# Patient Record
Sex: Female | Born: 1980 | Race: White | Hispanic: No | Marital: Married | Smoking: Never smoker
Health system: Southern US, Community
[De-identification: ages and names within clinical notes are randomized; demographics above are authoritative.]

## PROBLEM LIST (undated history)

## (undated) DIAGNOSIS — E230 Hypopituitarism: Secondary | ICD-10-CM

## (undated) DIAGNOSIS — O039 Complete or unspecified spontaneous abortion without complication: Secondary | ICD-10-CM

## (undated) DIAGNOSIS — N390 Urinary tract infection, site not specified: Secondary | ICD-10-CM

## (undated) DIAGNOSIS — O26899 Other specified pregnancy related conditions, unspecified trimester: Secondary | ICD-10-CM

## (undated) DIAGNOSIS — Z6791 Unspecified blood type, Rh negative: Secondary | ICD-10-CM

## (undated) DIAGNOSIS — E237 Disorder of pituitary gland, unspecified: Secondary | ICD-10-CM

## (undated) HISTORY — PX: WISDOM TOOTH EXTRACTION: SHX21

## (undated) HISTORY — DX: Complete or unspecified spontaneous abortion without complication: O03.9

## (undated) HISTORY — PX: ANKLE SURGERY: SHX546

---

## 2010-11-20 LAB — HIV ANTIBODY (ROUTINE TESTING W REFLEX)
HIV: NONREACTIVE
HIV: NONREACTIVE

## 2010-11-20 LAB — GC/CHLAMYDIA PROBE AMP, GENITAL
Chlamydia: NEGATIVE
Chlamydia: NEGATIVE
Chlamydia: NEGATIVE
Gonorrhea: NEGATIVE
Gonorrhea: NEGATIVE

## 2010-11-20 LAB — HEPATITIS B SURFACE ANTIGEN: Hepatitis B Surface Ag: NEGATIVE

## 2010-11-20 LAB — ANTIBODY SCREEN: Antibody Screen: NEGATIVE

## 2010-11-20 LAB — RPR
RPR: NONREACTIVE
RPR: NONREACTIVE
RPR: NONREACTIVE
RPR: NONREACTIVE
RPR: NONREACTIVE

## 2011-08-14 LAB — STREP B DNA PROBE: GBS: NEGATIVE

## 2011-08-27 NOTE — L&D Delivery Note (Deleted)
Delivery Note At 9:34 PM a viable female, "(Weston) Renaee Munda" was delivered via Vaginal, Spontaneous Delivery (Presentation: ;  ).  APGAR: 8, 9; weight 6 lb 9 oz (2977 g).   Placenta status: Intact, Expressed.  Cord: 3 vessels with the following complications: None.  Cord pH: NA  Anesthesia: None  Episiotomy:  Lacerations: 2nd degree Suture Repair: 2.0 3.0 vicryl Est. Blood Loss (mL): 300  Mom to postpartum.  Baby to skin to skin with dad and mom.  Circ planned as inpatient.  Nigel Bridgeman 09/14/2011, 10:46 PM

## 2011-08-27 NOTE — L&D Delivery Note (Signed)
Delivery Note  At 9:34 PM a viable female, "(Weston) Renaee Munda" was delivered via Vaginal, Spontaneous Delivery (Presentation OA: ; ), waterbirth. APGAR: 8, 9; weight 6 lb 9 oz (2977 g).  Placenta status: Intact, Expressed. Cord: 3 vessels with the following complications: CAN X 1, loose, with delivery through the cord in the tub. Cord pH: NA   Anesthesia: None  Episiotomy:  Lacerations: 2nd degree  Suture Repair: 2.0 3.0 vicryl  Est. Blood Loss (mL): 300   Mom to postpartum. Baby to skin to skin with dad and mom.  Circ planned as inpatient.   Nigel Bridgeman  09/14/2011, 10:46 PM

## 2011-09-14 ENCOUNTER — Inpatient Hospital Stay (HOSPITAL_COMMUNITY)
Admission: AD | Admit: 2011-09-14 | Discharge: 2011-09-16 | DRG: 775 | Disposition: A | Discharge status: Home / Self Care | Payer: 59 | Source: Ambulatory Visit | Attending: Obstetrics and Gynecology | Admitting: Obstetrics and Gynecology

## 2011-09-14 ENCOUNTER — Encounter (HOSPITAL_COMMUNITY): Payer: Self-pay | Admitting: *Deleted

## 2011-09-14 DIAGNOSIS — Z6791 Unspecified blood type, Rh negative: Secondary | ICD-10-CM

## 2011-09-14 DIAGNOSIS — N970 Female infertility associated with anovulation: Secondary | ICD-10-CM

## 2011-09-14 DIAGNOSIS — IMO0001 Reserved for inherently not codable concepts without codable children: Secondary | ICD-10-CM

## 2011-09-14 DIAGNOSIS — IMO0002 Reserved for concepts with insufficient information to code with codable children: Secondary | ICD-10-CM

## 2011-09-14 HISTORY — DX: Urinary tract infection, site not specified: N39.0

## 2011-09-14 HISTORY — DX: Unspecified blood type, rh negative: Z67.91

## 2011-09-14 HISTORY — DX: Hypopituitarism: E23.0

## 2011-09-14 HISTORY — DX: Other specified pregnancy related conditions, unspecified trimester: O26.899

## 2011-09-14 HISTORY — DX: Disorder of pituitary gland, unspecified: E23.7

## 2011-09-14 MED ORDER — ONDANSETRON HCL 4 MG/2ML IJ SOLN: 4.0000 mg | Freq: Four times a day (QID) | INTRAMUSCULAR | Status: DC | PRN

## 2011-09-14 MED ORDER — OXYTOCIN 20 UNITS IN LACTATED RINGERS INFUSION - SIMPLE: 125.0000 mL/h | Freq: Once | INTRAVENOUS | Status: DC

## 2011-09-14 MED ORDER — ACETAMINOPHEN 325 MG PO TABS: 650.0000 mg | ORAL_TABLET | ORAL | Status: DC | PRN

## 2011-09-14 MED ORDER — OXYTOCIN BOLUS FROM INFUSION
500.0000 mL | Freq: Once | INTRAVENOUS | Status: DC
  Filled 2011-09-14: qty 500

## 2011-09-14 MED ORDER — CITRIC ACID-SODIUM CITRATE 334-500 MG/5ML PO SOLN: 30.0000 mL | ORAL | Status: DC | PRN

## 2011-09-14 MED ORDER — FLEET ENEMA 7-19 GM/118ML RE ENEM: 1.0000 | ENEMA | RECTAL | Status: DC | PRN

## 2011-09-14 MED ORDER — OXYCODONE-ACETAMINOPHEN 5-325 MG PO TABS: 2.0000 | ORAL_TABLET | ORAL | Status: DC | PRN

## 2011-09-14 MED ORDER — LIDOCAINE HCL (PF) 1 % IJ SOLN
30.0000 mL | INTRAMUSCULAR | Status: DC | PRN
  Filled 2011-09-14: qty 30

## 2011-09-14 MED ORDER — IBUPROFEN 600 MG PO TABS
600.0000 mg | ORAL_TABLET | Freq: Four times a day (QID) | ORAL | Status: DC | PRN
  Administered 2011-09-14: 600 mg via ORAL
  Filled 2011-09-14: qty 1

## 2011-09-14 MED ORDER — LACTATED RINGERS IV SOLN: 500.0000 mL | INTRAVENOUS | Status: DC | PRN

## 2011-09-14 NOTE — Progress Notes (Signed)
  Subjective: In tub, feeling some pressure.  Doula, Melanie Johnston, and husband at bedside.  Objective: BP 124/90  Pulse 62  Temp(Src) 97.8 F (36.6 C) (Oral)  Resp 18  Ht 5\' 4"  (1.626 m)  Wt 141 lb (63.957 kg)  BMI 24.20 kg/m2      FHT:  Category 1 UC:   regular, every 3 minutes SVE:   Dilation: Lip/rim Effacement (%): 100 Station: +1 Exam by:: Melanie Johnston  Labs: No results found for this basename: WBC, HGB, HCT, MCV, PLT  Labs deferred at present  Assessment / Plan: Spontaneous labor, progressing normally Will continue to observe for increased pressure and urge to push.  Melanie Johnston 09/14/2011, 8:55 PM

## 2011-09-14 NOTE — Progress Notes (Signed)
Patient brought straight from the lobby. She is in for labor eval and c/o srom at 12pm. She reports decreased fetal movement.

## 2011-09-14 NOTE — Progress Notes (Signed)
Pt in birthing tub, Manfred Arch, CNM in room.  toco off per CNM, pt feeling lots of pressure.

## 2011-09-15 LAB — RH IG WORKUP (INCLUDES ABO/RH)
Antibody Screen: POSITIVE
Unit division: 0

## 2011-09-15 LAB — GC/CHLAMYDIA PROBE AMP, GENITAL: Chlamydia: NEGATIVE

## 2011-09-15 LAB — CBC
HCT: 35.2 % — ABNORMAL LOW (ref 36.0–46.0)
Hemoglobin: 12.3 g/dL (ref 12.0–15.0)
MCHC: 34.9 g/dL (ref 30.0–36.0)
WBC: 14.9 10*3/uL — ABNORMAL HIGH (ref 4.0–10.5)

## 2011-09-15 LAB — HIV ANTIBODY (ROUTINE TESTING W REFLEX): HIV: NONREACTIVE

## 2011-09-15 LAB — RPR
RPR Ser Ql: NONREACTIVE
RPR: NONREACTIVE

## 2011-09-15 MED ORDER — ONDANSETRON HCL 4 MG/2ML IJ SOLN: 4.0000 mg | INTRAMUSCULAR | Status: DC | PRN

## 2011-09-15 MED ORDER — BENZOCAINE-MENTHOL 20-0.5 % EX AERO
INHALATION_SPRAY | CUTANEOUS | Status: AC
  Administered 2011-09-15: 1 via TOPICAL
  Filled 2011-09-15: qty 56

## 2011-09-15 MED ORDER — OXYCODONE-ACETAMINOPHEN 5-325 MG PO TABS: 1.0000 | ORAL_TABLET | ORAL | Status: DC | PRN

## 2011-09-15 MED ORDER — IBUPROFEN 600 MG PO TABS
600.0000 mg | ORAL_TABLET | Freq: Four times a day (QID) | ORAL | Status: DC
  Administered 2011-09-15 – 2011-09-16 (×6): 600 mg via ORAL
  Filled 2011-09-15 (×5): qty 1

## 2011-09-15 MED ORDER — RHO D IMMUNE GLOBULIN 1500 UNIT/2ML IJ SOLN
300.0000 ug | Freq: Once | INTRAMUSCULAR | Status: AC
  Administered 2011-09-15: 300 ug via INTRAMUSCULAR
  Filled 2011-09-15: qty 2

## 2011-09-15 MED ORDER — MEASLES, MUMPS & RUBELLA VAC ~~LOC~~ INJ
0.5000 mL | INJECTION | Freq: Once | SUBCUTANEOUS | Status: DC
  Filled 2011-09-15: qty 0.5

## 2011-09-15 MED ORDER — DIBUCAINE 1 % RE OINT: 1.0000 "application " | TOPICAL_OINTMENT | RECTAL | Status: DC | PRN

## 2011-09-15 MED ORDER — ONDANSETRON HCL 4 MG PO TABS: 4.0000 mg | ORAL_TABLET | ORAL | Status: DC | PRN

## 2011-09-15 MED ORDER — SENNOSIDES-DOCUSATE SODIUM 8.6-50 MG PO TABS
2.0000 | ORAL_TABLET | Freq: Every day | ORAL | Status: DC
  Administered 2011-09-15: 2 via ORAL

## 2011-09-15 MED ORDER — WITCH HAZEL-GLYCERIN EX PADS: 1.0000 "application " | MEDICATED_PAD | CUTANEOUS | Status: DC | PRN

## 2011-09-15 MED ORDER — SIMETHICONE 80 MG PO CHEW: 80.0000 mg | CHEWABLE_TABLET | ORAL | Status: DC | PRN

## 2011-09-15 MED ORDER — MAGNESIUM HYDROXIDE 400 MG/5ML PO SUSP: 30.0000 mL | ORAL | Status: DC | PRN

## 2011-09-15 MED ORDER — ZOLPIDEM TARTRATE 5 MG PO TABS: 5.0000 mg | ORAL_TABLET | Freq: Every evening | ORAL | Status: DC | PRN

## 2011-09-15 MED ORDER — DIPHENHYDRAMINE HCL 25 MG PO CAPS: 25.0000 mg | ORAL_CAPSULE | Freq: Four times a day (QID) | ORAL | Status: DC | PRN

## 2011-09-15 MED ORDER — BENZOCAINE-MENTHOL 20-0.5 % EX AERO
1.0000 "application " | INHALATION_SPRAY | CUTANEOUS | Status: DC | PRN
  Administered 2011-09-15: 1 via TOPICAL

## 2011-09-15 MED ORDER — PRENATAL MULTIVITAMIN CH
1.0000 | ORAL_TABLET | Freq: Every day | ORAL | Status: DC
  Administered 2011-09-15 – 2011-09-16 (×2): 1 via ORAL
  Filled 2011-09-15 (×2): qty 1

## 2011-09-15 MED ORDER — TETANUS-DIPHTH-ACELL PERTUSSIS 5-2.5-18.5 LF-MCG/0.5 IM SUSP: 0.5000 mL | Freq: Once | INTRAMUSCULAR | Status: DC

## 2011-09-15 MED ORDER — LANOLIN HYDROUS EX OINT: TOPICAL_OINTMENT | CUTANEOUS | Status: DC | PRN

## 2011-09-15 NOTE — H&P (Signed)
Melanie Johnston is a 31 y.o. female, G1P0 at 65 3/7 weeks presents in active labor, with SROM at 12 noon, clear fluid noted.  UCs q 2 minutes, accompanied by husband and doula, [name].  Pregnancy remarkable for: Rh negative Hx infertility, IVF pregnancy GBS negative  History of present pregnancy: Patient entered care at 32 weeks, as a transfer from [facility] desiring waterbirth.  She had embryo transfer 12/19/10.  EDC of 09/11/11 was established by IVF dating.  Anatomy scan was done at 17 weeks, with normal findings.  Further ultrasounds were done at 38 weeks, with normal growth and fluid.  Her prenatal course was essentially uncomplicated.  She attended waterbirth class and signed the consent.  OB History    Grav Para Term Preterm Abortions TAB SAB Ect Mult Living   1 1 1       1      Past Medical History  Diagnosis Date  . UTI (lower urinary tract infection)     occ.  . 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 Rh negative, maternal    Past Surgical History  Procedure Date  . Wisdom tooth extraction   . Ankle surgery     dislocation at age 84   Family History: family history is not on file.MGF heart disease.  Mother, father, PGF, PGM hypertension.  Mother thyroid disease.  MGF stroke.  MGM kidney defect  Social History:  reports that she has never smoked. She does not have any smokeless tobacco history on file. She reports that she does not drink alcohol or use illicit drugs.  Married to FOB, "Wiggy", who is a Patent examiner medicine MD in [place].  Patient is the Interior and spatial designer of recruiting, full time employed and graduated educated.  She is Caucasian, of the [place] faith.  She has been followed by the CNM service at [facility].  ROA:  Contractions every 2 minutes, leaking of clear fluid, positive FM, small amount bloody show.  Physical exam: Chest clear Heart RRR without murmur Abd gravid, NT FHR initially non-reactive,  no decels UCs q 3 min, strong. Small amount bloody show  Blood pressure 146/84, pulse 62, temperature 98.1 F (36.7 C), temperature source Oral, resp. rate 20, height 5\' 4"  (1.626 m), weight 141 lb (63.957 kg), SpO2 98.00%, unknown if currently breastfeeding.   Prenatal labs: ABO, Rh: A/--/-- (07/11 0000) Antibody: Negative (03/27 0000) Rubella:  Immune RPR: Nonreactive, Nonreactive, Nonreactive, Nonreactive, Nonreactive, Nonreactive (03/27 0000)  HBsAg: Negative (03/27 0000)  HIV: Non-reactive, Non-reactive, Non-reactive, Non-reactive, Non-reactive (03/27 0000)  GBS: Negative (12/19 0000)  1st trimester screen normal. Triple marker WNL Glucola WNL Hgb 13.2 at NOB/13.2 at 28 weeks Cultures negative at NOB Pap WNL 7/11  Assessment/Plan: IUP at 40 3/7 weeks Active labor SROM at 12 noon GBS negative Plans waterbirth.  Plan: Admitted to [facility] per consult with Dr. Estanislado Pandy Routine CNM orders Continuous EFM initially OK for tub use   Nigel Bridgeman, CNM, [place] 09/14/11 7:50pm

## 2011-09-15 NOTE — Progress Notes (Signed)
Post Partum Day 1 Subjective: no complaints, up ad lib and voiding  Objective: Blood pressure 117/74, pulse 74, temperature 97.8 F (36.6 C), temperature source Oral, resp. rate 20, height 5\' 4"  (1.626 m), weight 141 lb (63.957 kg), SpO2 98.00%, unknown if currently breastfeeding.  Physical Exam:  General: alert, cooperative and no distress Lochia: appropriate Uterine Fundus: firm Incision:perineum healing well DVT Evaluation: neg Homan's sign bilaterally   Basename 09/15/11 0545  HGB 12.3  HCT 35.2*    Assessment/Plan: Plan for discharge tomorrow and Breastfeeding   LOS: 1 day   Jamari Diana 09/15/2011, 11:33 AM

## 2011-09-16 MED ORDER — BENZOCAINE-MENTHOL 20-0.5 % EX AERO
INHALATION_SPRAY | CUTANEOUS | Status: AC
  Filled 2011-09-16: qty 56

## 2011-09-16 MED ORDER — IBUPROFEN 600 MG PO TABS: 600.0000 mg | ORAL_TABLET | Freq: Four times a day (QID) | ORAL | Status: AC

## 2011-09-16 NOTE — Progress Notes (Signed)
Patient ID: Melanie Johnston, female   DOB: Oct 27, 1980, 31 y.o.   MRN: [id number] Post Partum Day 2 Subjective: no complaints, up ad lib without syncope, voiding, tolerating PO, + flatus  Pain well controlled with po meds BF well Mood stable, bonding well Very pleased with waterbirth   Objective: Blood pressure 126/84, pulse 60, temperature 97.9 F (36.6 C), temperature source Oral, resp. rate 18, height 5\' 4"  (1.626 m), weight 63.957 kg (141 lb), SpO2 98.00%, unknown if currently breastfeeding.  Physical Exam:  General: alert and no distress Lungs: CTAB Heart: RRR Breasts: soft, nipples intact Lochia: appropriate Uterine Fundus: firm Perineum: healing DVT Evaluation: No evidence of DVT seen on physical exam. Negative Homan's sign. No significant calf/ankle edema.   Basename 09/15/11 0545  HGB 12.3  HCT 35.2*    Assessment/Plan: Discharge home, Breastfeeding and Contraception plans condoms or diaphragm,   Stable PP    LOS: 2 days   Margeaux Swantek M 09/16/2011, 9:31 AM

## 2011-09-16 NOTE — Discharge Summary (Signed)
   Obstetric Discharge Summary Reason for Admission: onset of labor and rupture of membranes Prenatal Procedures: ultrasound Intrapartum Procedures: spontaneous vaginal delivery and waterbirth Postpartum Procedures: Rho(D) Ig Complications-Operative and Postpartum: 2nd degree perineal laceration  Temp:  [97.5 F (36.4 C)-99.2 F (37.3 C)] 97.9 F (36.6 C) (01/21 0530) Pulse Rate:  [49-67] 60  (01/21 0643) Resp:  [17-18] 18  (01/21 0530) BP: (114-126)/(71-85) 126/84 mmHg (01/21 0530) Hemoglobin  Date Value Range Status  09/15/2011 12.3  12.0-15.0 (g/dL) Final     HCT  Date Value Range Status  09/15/2011 35.2* 36.0-46.0 (%) Final    Hospital Course:  Hospital Course: Admitted in labor with a  Rim of cervix and SROM. neg GBS. Progressed to fully dilated, . Delivery was performed by V.Emilee Hero, CNM  without difficulty, in the birth tub. Patient and baby tolerated the procedure without difficulty, with a 2nd laceration noted. Infant to FTN. Mother and infant then had an uncomplicated postpartum course, with breast feeding going well. Mom's physical exam was WNL, and she was discharged home in stable condition. Contraception plan was condoms/diaphragm.  She received adequate benefit from po pain medications.  Discharge Diagnoses: Term Pregnancy-delivered  Discharge Information: Date: 09/16/2011 Activity: pelvic rest Diet: routine Medications:  Medication List  As of 09/16/2011  1:15 PM   START taking these medications         ibuprofen 600 MG tablet   Commonly known as: ADVIL,MOTRIN   Take 1 tablet (600 mg total) by mouth every 6 (six) hours.         CONTINUE taking these medications         MAGNESIUM PO      omega-3 acid ethyl esters 1 G capsule   Commonly known as: LOVAZA         STOP taking these medications         OVER THE COUNTER MEDICATION          Where to get your medications    These are the prescriptions that you need to pick up.   You may get these  medications from any pharmacy.         ibuprofen 600 MG tablet           Condition: stable Instructions: refer to practice specific booklet Discharge to: home Follow-up Information    Follow up with LATHAM, VICKI, CNM in 6 weeks. (As needed)    Contact information:   [address] [place] [address] [phone]          Newborn Data: Live born  Information for the patient's newborn:  Cadence, Minton [[id number]]  female ; APGAR ,8, 9  ; weight ; 8#9oz Home with mother.  Juda Lajeunesse M 09/16/2011, 1:15 PM

## 2011-10-11 ENCOUNTER — Encounter (HOSPITAL_COMMUNITY): Payer: Self-pay

## 2011-10-11 ENCOUNTER — Inpatient Hospital Stay (HOSPITAL_COMMUNITY)
Admission: AD | Admit: 2011-10-11 | Discharge: 2011-10-12 | Disposition: A | Discharge status: Home / Self Care | Payer: 59 | Source: Ambulatory Visit | Attending: Obstetrics and Gynecology | Admitting: Obstetrics and Gynecology

## 2011-10-11 DIAGNOSIS — O909 Complication of the puerperium, unspecified: Secondary | ICD-10-CM | POA: Insufficient documentation

## 2011-10-11 LAB — WET PREP, GENITAL

## 2011-10-11 NOTE — Discharge Instructions (Signed)
Soak in tub twice a day. Chip Boer will call you if treatment for a vaginal infection is needed. Keep scheduled postpartum visit.

## 2011-10-11 NOTE — Progress Notes (Signed)
Pt states, " I delivered on Jan 19th and I had a second degree tear. I noticed on Wed pm that I had some irritation and a little itching. It still feels irritated."

## 2011-10-11 NOTE — Progress Notes (Signed)
Patient is here with c/o possible vaginal tear. She states that it feels the stitch didn't hold. Denies any bleeding or other discomfort

## 2011-10-12 NOTE — ED Provider Notes (Signed)
History    31 yo G1P1 at 4 weeks post vaginal delivery presents requesting evaluation of healing perineum--"concerned if a stitch pulled apart".  No pain.  Does note vaginal odor.  Breastfeeding.  No IC yet.  Had 2nd degree laceration at delivery, attended by Nigel Bridgeman, CNM.    Chief Complaint  Patient presents with  . Wound Check     OB History    Grav Para Term Preterm Abortions TAB SAB Ect Mult Living   1 1 1       1       Past Medical History  Diagnosis Date  . UTI (lower urinary tract infection)     occ.  . 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 Rh negative, maternal     Past Surgical History  Procedure Date  . Wisdom tooth extraction   . Ankle surgery     dislocation at age 70    History reviewed. No pertinent family history.  History  Substance Use Topics  . Smoking status: Never Smoker   . Smokeless tobacco: Not on file  . Alcohol Use: No    Allergies: No Known Allergies  No prescriptions prior to admission     Physical Exam   Blood pressure 112/66, pulse 63, temperature 98.6 F (37 C), temperature source Oral, resp. rate 16, height 5' 4.5" (1.638 m), weight 57.153 kg (126 lb), currently breastfeeding.  Chest clear Heart RRR without murmur Abd NT  Pelvic--2 small stitch remnants noted on left inner introitus.  These were removed without difficulty, and no disruption of tissue occurred.  There is a small separation of previous repair inside the right vagina wall, but no abnormal granulation tissue is noted and the area is non-tender.  Edges are clean and healed.  There is a small mound of redundant vaginal tissue just inside the introitus, but this is non-tender and flexible.  No perineal skin defects are noted from healing of 2nd degree laceration.  No other issues are noted.  Area is clean and NT.  Small amount white vaginal discharge is noted in the vault. Wet prep is negative.     ED Course    Normal healing of vaginal lacerations s/p SVB 1/19  Plan: Perineal hygiene reviewed.  Will do warm soaks in tub BID and prn. Keep scheduled pp appointment in 2 weeks or call prn.  Nigel Bridgeman, CNM, MN  10/12/11 2:37am

## 2011-10-30 ENCOUNTER — Ambulatory Visit: Payer: Self-pay

## 2013-07-16 ENCOUNTER — Encounter: Payer: Self-pay | Admitting: Sports Medicine

## 2013-07-16 ENCOUNTER — Ambulatory Visit (INDEPENDENT_AMBULATORY_CARE_PROVIDER_SITE_OTHER): Payer: 59 | Admitting: Sports Medicine

## 2013-07-16 VITALS — BP 94/56 | HR 61 | Wt 119.0 lb

## 2013-07-16 DIAGNOSIS — M7750 Other enthesopathy of unspecified foot: Secondary | ICD-10-CM

## 2013-07-16 DIAGNOSIS — S9305XA Dislocation of left ankle joint, initial encounter: Secondary | ICD-10-CM | POA: Insufficient documentation

## 2013-07-16 DIAGNOSIS — S93332A Other subluxation of left foot, initial encounter: Secondary | ICD-10-CM | POA: Insufficient documentation

## 2013-07-16 DIAGNOSIS — M7672 Peroneal tendinitis, left leg: Secondary | ICD-10-CM

## 2013-07-16 MED ORDER — CELECOXIB 200 MG PO CAPS: ORAL_CAPSULE | ORAL | Status: DC

## 2013-07-16 NOTE — Assessment & Plan Note (Addendum)
Peroneal tendon sheath injection as above. Strapped with compressive dressing. Celebrex samples given. Home rehabilitation. Return next week for custom orthotics.

## 2013-07-16 NOTE — Progress Notes (Addendum)
  Subjective:    CC: Left ankle pain  HPI:  This is a very pleasant 32 year old female, she is the wife of Dr. Brent Bulla.  She comes in with a several week history of pain that she localizes behind the lateral malleolus of her left ankle. Pain is moderate, persistent, and came on relatively rapidly. She does deny any trauma. There is also swelling. She does have a history of plantar fasciitis and has some orthotics created by a podiatrist. She denies any numbness or tingling.  Past medical history, Surgical history, Family history not pertinant except as noted below, Social history, Allergies, and medications have been entered into the medical record, reviewed, and no changes needed.   Review of Systems: No headache, visual changes, nausea, vomiting, diarrhea, constipation, dizziness, abdominal pain, skin rash, fevers, chills, night sweats, swollen lymph nodes, weight loss, chest pain, body aches, joint swelling, muscle aches, shortness of breath, mood changes, visual or auditory hallucinations.  Objective:    General: Well Developed, well nourished, and in no acute distress.  Neuro: Alert and oriented x3, extra-ocular muscles intact, sensation grossly intact.  HEENT: Normocephalic, atraumatic, pupils equal round reactive to light, neck supple, no masses, no lymphadenopathy, thyroid nonpalpable.  Skin: Warm and dry, no rashes noted.  Cardiac: Regular rate and rhythm, no murmurs rubs or gallops.  Respiratory: Clear to auscultation bilaterally. Not using accessory muscles, speaking in full sentences.  Abdominal: Soft, nontender, nondistended, positive bowel sounds, no masses, no organomegaly.  Left Ankle: Visibly swollen behind the lateral malleolus. Melanie Johnston does have pes cavus with a supinated foot. She also has some evidence of breakdown of the transverse arch with mild clawing of all of her toes. There is no abnormal callus beneath the second through third metatarsal heads. Range of motion is full  in all directions. Strength is 5/5 in all directions. Stable lateral and medial ligaments; squeeze test and kleiger test unremarkable; Talar dome nontender; No pain at base of 5th MT; No tenderness over cuboid; No tenderness over N spot or navicular prominence Tender to palpation just distal and posterior to the tip of the lateral malleolus, reproduction of pain with resisted eversion of the foot in a plantar flexed position. Negative tarsal tunnel tinel's Able to walk 4 steps.  Procedure: Real-time Ultrasound Guided Injection of left peroneal tendon sheath Device: GE Logiq E  Verbal informed consent obtained, risks, benefits, including risk of infection and tendon rupture were explained.  Time-out conducted.  Noted no overlying erythema, induration, or other signs of local infection.  Skin prepped in a sterile fashion.  Local anesthesia: Topical Ethyl chloride.  With sterile technique and under real time ultrasound guidance:  Noted copious fluid in the peroneal tendon sheath, the peroneus longus and brevis tendons appear intact in both long and short axis. 25-gauge needle was advanced into the sheath, and 1 cc Kenalog 40, 3 cc lidocaine injected easily. Completed without difficulty  Pain immediately resolved suggesting accurate placement of the medication.  Advised to call if fevers/chills, erythema, induration, drainage, or persistent bleeding.  Images permanently stored and available for review in the ultrasound unit.  Impression: Technically successful ultrasound guided injection.  Impression and Recommendations:    The patient was counselled, risk factors were discussed, anticipatory guidance given.

## 2013-07-20 ENCOUNTER — Encounter: Payer: PRIVATE HEALTH INSURANCE | Admitting: Sports Medicine

## 2013-07-28 ENCOUNTER — Encounter: Payer: Self-pay | Admitting: Sports Medicine

## 2013-07-28 ENCOUNTER — Ambulatory Visit (INDEPENDENT_AMBULATORY_CARE_PROVIDER_SITE_OTHER): Payer: PRIVATE HEALTH INSURANCE | Admitting: Sports Medicine

## 2013-07-28 VITALS — BP 109/67 | HR 54 | Wt 116.0 lb

## 2013-07-28 DIAGNOSIS — M7750 Other enthesopathy of unspecified foot: Secondary | ICD-10-CM

## 2013-07-28 DIAGNOSIS — M7672 Peroneal tendinitis, left leg: Secondary | ICD-10-CM

## 2013-07-28 NOTE — Assessment & Plan Note (Signed)
Peroneal tendinitis completely resolved after guided injection. Still has some plantar fasciitis symptoms. Custom orthotics as above. Return as needed.

## 2013-07-28 NOTE — Progress Notes (Signed)
    Patient was fitted for a : standard, cushioned, semi-rigid orthotic. The orthotic was heated and afterward the patient stood on the orthotic blank positioned on the orthotic stand. The patient was positioned in subtalar neutral position and 10 degrees of ankle dorsiflexion in a weight bearing stance. After completion of molding, a stable base was applied to the orthotic blank. The blank was ground to a stable position for weight bearing. Size:6 Base: Blue EVA Additional Posting and Padding: None The patient ambulated these, and they were very comfortable.  I spent 40 minutes with this patient, greater than 50% was face-to-face time counseling regarding the below diagnosis.   

## 2013-10-14 ENCOUNTER — Encounter: Payer: Self-pay | Admitting: Obstetrics & Gynecology

## 2013-10-14 ENCOUNTER — Ambulatory Visit (INDEPENDENT_AMBULATORY_CARE_PROVIDER_SITE_OTHER): Payer: No Typology Code available for payment source | Admitting: Obstetrics & Gynecology

## 2013-10-14 VITALS — BP 104/59 | Wt 119.0 lb

## 2013-10-14 DIAGNOSIS — E079 Disorder of thyroid, unspecified: Secondary | ICD-10-CM

## 2013-10-14 DIAGNOSIS — Z3480 Encounter for supervision of other normal pregnancy, unspecified trimester: Secondary | ICD-10-CM

## 2013-10-14 DIAGNOSIS — O99280 Endocrine, nutritional and metabolic diseases complicating pregnancy, unspecified trimester: Secondary | ICD-10-CM

## 2013-10-14 DIAGNOSIS — Z3490 Encounter for supervision of normal pregnancy, unspecified, unspecified trimester: Secondary | ICD-10-CM

## 2013-10-14 MED ORDER — ONDANSETRON HCL 4 MG PO TABS: 4.0000 mg | ORAL_TABLET | Freq: Three times a day (TID) | ORAL | Status: DC | PRN

## 2013-10-14 NOTE — Progress Notes (Signed)
Bedside U/S showed IUP with CRL 10.236mm

## 2013-10-14 NOTE — Progress Notes (Signed)
P - 75 - Pt had Pap in December 2014 at PCP

## 2013-10-14 NOTE — Progress Notes (Signed)
   Subjective:    Melanie Johnston is a G2P1001 5458w1d being seen today for her first obstetrical visit.  Her obstetrical history is significant for prior water birth, on thyroid replacement (managed by naturalpathy MD). Patient does intend to breast feed. Pregnancy history fully reviewed.  Patient reports nausea.  Filed Vitals:   10/14/13 1505  BP: 104/59  Weight: 119 lb (53.978 kg)    HISTORY: OB History  Gravida Para Term Preterm AB SAB TAB Ectopic Multiple Living  2 1 1       1     # Outcome Date GA Lbr Len/2nd Weight Sex Delivery Anes PTL Lv  2 CUR           1 TRM 09/14/11 2873w3d 09:30 / 00:04 6 lb 9 oz (2.977 kg) M SVD None  Y     Past Medical History  Diagnosis Date  . UTI (lower urinary tract infection)     occ.  . 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 Rh negative, maternal    Past Surgical History  Procedure Laterality Date  . Wisdom tooth extraction    . Ankle surgery      dislocation at age 33   History reviewed. No pertinent family history.   Exam    Uterus:     Pelvic Exam:    Perineum: No Hemorrhoids   Vulva: normal   Vagina:  normal mucosa   pH: n/a   Cervix: nabothian cyst, ectrop.   Adnexa: normal adnexa   Bony Pelvis: average  System: Breast:  Pt not undressed for breast exam.   Pt states nml exam 2 months ago with PCP   Skin: normal coloration and turgor, no rashes    Neurologic: oriented, normal   Extremities: normal strength, tone, and muscle mass   HEENT sclera clear, anicteric, oropharynx clear, no lesions and neck supple with midline trachea   Mouth/Teeth mucous membranes moist, pharynx normal without lesions and dental hygiene good   Neck supple and no masses   Cardiovascular: regular rate and rhythm   Respiratory:  chest clear, no wheezing, crepitations, rhonchi, normal symmetric air entry   Abdomen: soft, nt   Urinary: urethral meatus normal      Assessment:    Pregnancy:  G2P1001 Patient Active Problem List   Diagnosis Date Noted  . Supervision of low-risk pregnancy 10/14/2013  . Left peroneal tendonitis 07/16/2013  . Normal vaginal delivery 09/14/2011        Plan:     Initial labs drawn. Prenatal vitamins. Problem list reviewed and updated. Genetic Screening discussed First Screen: ordered.  Ultrasound discussed; fetal survey: requested.  Follow up in 4 weeks. Zofran for nausea Pt had TSH drawn 2 weeks ago by [facility] MD--nml per pt.  Gissel Keilman H. 10/14/2013

## 2013-10-15 LAB — OBSTETRIC PANEL
Antibody Screen: NEGATIVE
BASOS ABS: 0 10*3/uL (ref 0.0–0.1)
BASOS PCT: 0 % (ref 0–1)
EOS ABS: 0.3 10*3/uL (ref 0.0–0.7)
EOS PCT: 3 % (ref 0–5)
HCT: 38.8 % (ref 36.0–46.0)
Hemoglobin: 13.3 g/dL (ref 12.0–15.0)
Hepatitis B Surface Ag: NEGATIVE
Lymphocytes Relative: 20 % (ref 12–46)
Lymphs Abs: 1.8 10*3/uL (ref 0.7–4.0)
MCH: 31.6 pg (ref 26.0–34.0)
MCHC: 34.3 g/dL (ref 30.0–36.0)
MCV: 92.2 fL (ref 78.0–100.0)
Monocytes Absolute: 0.6 10*3/uL (ref 0.1–1.0)
Monocytes Relative: 7 % (ref 3–12)
Neutro Abs: 6.4 10*3/uL (ref 1.7–7.7)
Neutrophils Relative %: 70 % (ref 43–77)
PLATELETS: 291 10*3/uL (ref 150–400)
RBC: 4.21 MIL/uL (ref 3.87–5.11)
RDW: 13 % (ref 11.5–15.5)
Rh Type: NEGATIVE
Rubella: 8.07 Index — ABNORMAL HIGH (ref <0.90)
WBC: 9.1 10*3/uL (ref 4.0–10.5)

## 2013-10-15 LAB — GC/CHLAMYDIA PROBE AMP
CT PROBE, AMP APTIMA: NEGATIVE
GC PROBE AMP APTIMA: NEGATIVE

## 2013-10-15 LAB — HIV ANTIBODY (ROUTINE TESTING W REFLEX): HIV: NONREACTIVE

## 2013-10-16 LAB — CULTURE, OB URINE
Colony Count: NO GROWTH
ORGANISM ID, BACTERIA: NO GROWTH

## 2013-10-17 ENCOUNTER — Encounter: Payer: Self-pay | Admitting: Obstetrics & Gynecology

## 2013-10-17 DIAGNOSIS — O26899 Other specified pregnancy related conditions, unspecified trimester: Secondary | ICD-10-CM | POA: Insufficient documentation

## 2013-10-17 DIAGNOSIS — Z6791 Unspecified blood type, Rh negative: Secondary | ICD-10-CM | POA: Insufficient documentation

## 2013-10-17 DIAGNOSIS — D696 Thrombocytopenia, unspecified: Secondary | ICD-10-CM | POA: Insufficient documentation

## 2013-11-12 ENCOUNTER — Encounter: Payer: Self-pay | Admitting: Family

## 2013-11-12 ENCOUNTER — Ambulatory Visit (INDEPENDENT_AMBULATORY_CARE_PROVIDER_SITE_OTHER): Payer: PRIVATE HEALTH INSURANCE | Admitting: Family

## 2013-11-12 VITALS — BP 104/64 | Wt 118.0 lb

## 2013-11-12 DIAGNOSIS — Z3490 Encounter for supervision of normal pregnancy, unspecified, unspecified trimester: Secondary | ICD-10-CM

## 2013-11-12 DIAGNOSIS — D696 Thrombocytopenia, unspecified: Secondary | ICD-10-CM

## 2013-11-12 DIAGNOSIS — Z3480 Encounter for supervision of other normal pregnancy, unspecified trimester: Secondary | ICD-10-CM

## 2013-11-12 NOTE — Progress Notes (Signed)
No questions or concerns. Discussed waterbirth plans; +doula Ovidio KinKenny Schulman.  First screen scheduled for next week.

## 2013-11-12 NOTE — Progress Notes (Signed)
p=71 

## 2013-11-24 ENCOUNTER — Encounter: Payer: Self-pay | Admitting: *Deleted

## 2013-11-29 ENCOUNTER — Other Ambulatory Visit: Payer: Self-pay | Admitting: Obstetrics & Gynecology

## 2013-11-29 ENCOUNTER — Telehealth: Payer: Self-pay | Admitting: *Deleted

## 2013-11-29 ENCOUNTER — Other Ambulatory Visit: Payer: Self-pay | Admitting: Family

## 2013-11-29 ENCOUNTER — Ambulatory Visit (INDEPENDENT_AMBULATORY_CARE_PROVIDER_SITE_OTHER): Payer: No Typology Code available for payment source | Admitting: Family

## 2013-11-29 VITALS — BP 118/72

## 2013-11-29 DIAGNOSIS — O039 Complete or unspecified spontaneous abortion without complication: Secondary | ICD-10-CM

## 2013-11-29 DIAGNOSIS — O26899 Other specified pregnancy related conditions, unspecified trimester: Secondary | ICD-10-CM

## 2013-11-29 DIAGNOSIS — O364XX0 Maternal care for intrauterine death, not applicable or unspecified: Secondary | ICD-10-CM

## 2013-11-29 DIAGNOSIS — IMO0002 Reserved for concepts with insufficient information to code with codable children: Secondary | ICD-10-CM

## 2013-11-29 DIAGNOSIS — Z3490 Encounter for supervision of normal pregnancy, unspecified, unspecified trimester: Secondary | ICD-10-CM

## 2013-11-29 DIAGNOSIS — O360990 Maternal care for other rhesus isoimmunization, unspecified trimester, not applicable or unspecified: Secondary | ICD-10-CM

## 2013-11-29 DIAGNOSIS — Z6791 Unspecified blood type, Rh negative: Secondary | ICD-10-CM

## 2013-11-29 MED ORDER — RHO D IMMUNE GLOBULIN 1500 UNIT/2ML IJ SOLN
300.0000 ug | Freq: Once | INTRAMUSCULAR | Status: AC
  Administered 2013-11-29: 300 ug via INTRAMUSCULAR

## 2013-11-29 MED ORDER — MISOPROSTOL 200 MCG PO TABS: 800.0000 ug | ORAL_TABLET | Freq: Once | ORAL | Status: DC

## 2013-11-29 MED ORDER — IBUPROFEN 800 MG PO TABS: 800.0000 mg | ORAL_TABLET | Freq: Three times a day (TID) | ORAL | Status: DC | PRN

## 2013-11-29 NOTE — Progress Notes (Signed)
p-82  Started bleeding on Friday.  See scanned lab from MFM  Abnormal soft markers and labs pending  CRL 38.9 mm fetal demise

## 2013-11-29 NOTE — Telephone Encounter (Signed)
Pt is scheduled for pelvic U/S 11/30/13 @ 10:30 @ [facility].  Pt aware.

## 2013-11-29 NOTE — Progress Notes (Signed)
Reports spotting that began on Friday; bleeding increased yesterday with the passage of two clots.  Denies any pain or cramping.  First screen showed signs of cystic hygroma and fetal abdominal wall defect.  Labs are pending.  No fetal heart rate detected by L. Clark.  [facility]  Consulted with Dr. Erin FullingHarraway-Smith > since pt last PO intake at 0900 today, schedule for D&E in the AM based on OR availability.  [facility]  Rhogam in office today.

## 2013-11-29 NOTE — Progress Notes (Signed)
Pt was dx'd with a 10week loss (at 13 weeks) she now c/o bleeding.  She believes that she has passed most of the tissue however she is still bleeding. She only wants to come in if she needs to. D/w pt and her husband who is a physician, taking cytotec.  Discussed the benefits.  She wants to have it filled.  Rx sent to pharmacy with Motrin.  clh-S

## 2013-11-30 ENCOUNTER — Other Ambulatory Visit: Payer: Self-pay | Admitting: Family Medicine

## 2013-11-30 ENCOUNTER — Ambulatory Visit (HOSPITAL_BASED_OUTPATIENT_CLINIC_OR_DEPARTMENT_OTHER)
Admission: RE | Admit: 2013-11-30 | Discharge: 2013-11-30 | Disposition: A | Discharge status: Home / Self Care | Payer: No Typology Code available for payment source | Source: Ambulatory Visit | Attending: Family | Admitting: Family

## 2013-11-30 ENCOUNTER — Encounter (HOSPITAL_COMMUNITY): Payer: Self-pay | Admitting: Anesthesiology

## 2013-11-30 DIAGNOSIS — R059 Cough, unspecified: Secondary | ICD-10-CM | POA: Insufficient documentation

## 2013-11-30 DIAGNOSIS — O364XX0 Maternal care for intrauterine death, not applicable or unspecified: Secondary | ICD-10-CM | POA: Insufficient documentation

## 2013-11-30 DIAGNOSIS — Z3490 Encounter for supervision of normal pregnancy, unspecified, unspecified trimester: Secondary | ICD-10-CM

## 2013-11-30 DIAGNOSIS — R05 Cough: Secondary | ICD-10-CM | POA: Insufficient documentation

## 2013-11-30 DIAGNOSIS — O039 Complete or unspecified spontaneous abortion without complication: Secondary | ICD-10-CM

## 2013-11-30 LAB — ANTIBODY SCREEN: ANTIBODY SCREEN: NEGATIVE

## 2013-11-30 MED ORDER — MISOPROSTOL 200 MCG PO TABS: 400.0000 ug | ORAL_TABLET | Freq: Once | ORAL | Status: DC

## 2013-12-01 ENCOUNTER — Encounter (HOSPITAL_COMMUNITY): Admission: RE | Payer: Self-pay | Source: Ambulatory Visit

## 2013-12-01 ENCOUNTER — Ambulatory Visit (HOSPITAL_COMMUNITY)
Admission: RE | Admit: 2013-12-01 | Payer: No Typology Code available for payment source | Source: Ambulatory Visit | Admitting: Obstetrics & Gynecology

## 2013-12-01 ENCOUNTER — Ambulatory Visit (HOSPITAL_COMMUNITY): Payer: PRIVATE HEALTH INSURANCE

## 2013-12-01 SURGERY — DILATION AND EVACUATION, UTERUS
Anesthesia: Choice

## 2013-12-02 ENCOUNTER — Encounter: Payer: Self-pay | Admitting: Obstetrics & Gynecology

## 2013-12-02 ENCOUNTER — Ambulatory Visit (INDEPENDENT_AMBULATORY_CARE_PROVIDER_SITE_OTHER): Payer: No Typology Code available for payment source | Admitting: Obstetrics & Gynecology

## 2013-12-02 DIAGNOSIS — O039 Complete or unspecified spontaneous abortion without complication: Secondary | ICD-10-CM

## 2013-12-02 HISTORY — DX: Complete or unspecified spontaneous abortion without complication: O03.9

## 2013-12-02 NOTE — Progress Notes (Signed)
   CLINIC ENCOUNTER NOTE  History:  33 y.o. [id number] here today for follow up after SAB. She has misoprostol administration after her SAB for retained POCs.  Of note, SAB products were sent for chromosomal analysis and were positive for Trisomy 18. Patient just has scant bleeding, no other concerns.  The following portions of the patient's history were reviewed and updated as appropriate: allergies, current medications, past family history, past medical history, past social history, past surgical history and problem list.  Review of Systems:  Pertinent items are noted in HPI.  Objective:  Physical Exam LMP 08/18/2013 Gen: NAD Abd: Soft, nontender and nondistended Pelvic: Scant blood on pad  Labs and Imaging 12/02/2013 Office Ultrasound 13mm endometrial stripe, no focal area seen. Normal appearance after SAB  11/30/2013   CLINICAL DATA:  Cough ultrasound yesterday current fetal demise of an intrauterine pregnancy. Patient complaining of heavy vaginal bleeding since last night with cramping.  EXAM: OBSTETRIC <14 WK US AND TRANSVAGINAL OB US  TECHNIQUE: Both transabdominal and transvaginal ultrasound examinations were performed for complete evaluation of the gestation as well as the maternal uterus, adnexal regions, and pelvic cul-de-sac. Transvaginal technique was performed to assess early pregnancy.  COMPARISON:  None.  FINDINGS: Intrauterine gestational sac: No gestational sac. There is an irregular area fluid and associated and the echogenicity soft tissue projecting eccentrically within the mid endometrial canal. The endometrium is mildly thickened. There is increased blood flow to the endometrium adjacent to the irregular fluid collection.  Yolk sac:  None  Embryo:  None  Maternal uterus/adnexae: No uterine mass. Cervix is closed. Both ovaries are identified and are normal in size and echogenicity. No adnexal masses.  IMPRESSION: Irregular fluid collection with heterogeneous soft tissue and  increased vascularity projects within the mid uterus, along the thickened endometrium. This is likely a small area of retained products of conception.  No normal gestational sac.  No embryo.   Electronically Signed   By: Amie Portlandavid  Ormond M.D.   On: 11/30/2013 11:39    Assessment & Plan:  Completed SAB; Trisomy 18  Patient reassured, appropriate support given to patient. She will follow up as needed, will continue PNV and folic acid for now as she hopes to conceive soon. Routine preventative health maintenance measures emphasized.   Jaynie CollinsUGONNA  ANYANWU, MD, FACOG Attending Obstetrician & Gynecologist Faculty Practice, [facility]

## 2013-12-02 NOTE — Patient Instructions (Signed)
Return to clinic for any scheduled appointments or for any gynecologic concerns as needed.   

## 2013-12-03 ENCOUNTER — Encounter: Payer: Self-pay | Admitting: *Deleted

## 2014-03-04 ENCOUNTER — Encounter: Payer: Self-pay | Admitting: Advanced Practice Midwife

## 2014-03-04 ENCOUNTER — Ambulatory Visit (INDEPENDENT_AMBULATORY_CARE_PROVIDER_SITE_OTHER): Payer: No Typology Code available for payment source | Admitting: Advanced Practice Midwife

## 2014-03-04 VITALS — BP 118/74 | HR 75 | Wt 119.0 lb

## 2014-03-04 DIAGNOSIS — E039 Hypothyroidism, unspecified: Secondary | ICD-10-CM

## 2014-03-04 DIAGNOSIS — Z3481 Encounter for supervision of other normal pregnancy, first trimester: Secondary | ICD-10-CM

## 2014-03-04 DIAGNOSIS — Z3480 Encounter for supervision of other normal pregnancy, unspecified trimester: Secondary | ICD-10-CM

## 2014-03-04 LAB — CBC
HCT: 41.2 % (ref 36.0–46.0)
HEMOGLOBIN: 13.8 g/dL (ref 12.0–15.0)
MCH: 31 pg (ref 26.0–34.0)
MCHC: 33.5 g/dL (ref 30.0–36.0)
MCV: 92.6 fL (ref 78.0–100.0)
Platelets: 245 10*3/uL (ref 150–400)
RBC: 4.45 MIL/uL (ref 3.87–5.11)
RDW: 13.3 % (ref 11.5–15.5)
WBC: 7 10*3/uL (ref 4.0–10.5)

## 2014-03-04 MED ORDER — PROMETHAZINE HCL 25 MG PO TABS: 25.0000 mg | ORAL_TABLET | Freq: Four times a day (QID) | ORAL | Status: DC | PRN

## 2014-03-04 MED ORDER — METOCLOPRAMIDE HCL 10 MG PO TABS: 10.0000 mg | ORAL_TABLET | Freq: Three times a day (TID) | ORAL | Status: DC

## 2014-03-04 NOTE — Progress Notes (Signed)
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

## 2014-03-06 LAB — CULTURE, URINE COMPREHENSIVE
COLONY COUNT: NO GROWTH
ORGANISM ID, BACTERIA: NO GROWTH

## 2014-03-07 LAB — ANTIBODY TITER (PRENATAL TITER): Ab Titer: <2

## 2014-03-07 LAB — PRENATAL ANTIBODY IDENTIFICATION

## 2014-03-07 LAB — ANTIBODY SCREEN: ANTIBODY SCREEN: POSITIVE — AB

## 2014-03-08 ENCOUNTER — Encounter: Payer: Self-pay | Admitting: Advanced Practice Midwife

## 2014-03-08 NOTE — Progress Notes (Signed)
New OB. Routines reviewed. See Smart Set  Subjective:    Melanie Johnston is a G3P1001 5232w6d being seen today for her first obstetrical visit.  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 Patient does intend to breast feed. Pregnancy history fully reviewed.  Patient reports no complaints.  Filed Vitals:   03/04/14 1017  BP: 118/74  Pulse: 75  Weight: 53.978 kg (119 lb)    HISTORY: OB History  Gravida Para Term Preterm AB SAB TAB Ectopic Multiple Living  3 1 1       1     # Outcome Date GA Lbr Len/2nd Weight Sex Delivery Anes PTL Lv  3 CUR           2 TRM 09/14/11 3365w3d 09:30 / 00:04 2.977 kg (6 lb 9 oz) M SVD None  Y  1 GRA              Comments: System Generated. Please review and update pregnancy details.     Past Medical History  Diagnosis Date  . UTI (lower urinary tract infection)     occ.  . 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 Rh negative, maternal   . SAB (spontaneous abortion) 12/02/2013    Chromosomal analysis showed Trisomy 18.   Past Surgical History  Procedure Laterality Date  . Wisdom tooth extraction    . Ankle surgery      dislocation at age 33   History reviewed. No pertinent family history.   Exam    Uterus:  Fundal Height: 7 cm  Pelvic Exam:    Perineum: No Hemorrhoids, Normal Perineum   Vulva: Bartholin's, Urethra, Skene's normal   Vagina:  normal mucosa, normal discharge   pH:    Cervix: no lesions   Adnexa: no mass, fullness, tenderness   Bony Pelvis: gynecoid  System: Breast:  normal appearance, no masses or tenderness   Skin: normal coloration and turgor, no rashes    Neurologic: oriented, grossly non-focal   Extremities: normal strength, tone, and muscle mass   HEENT neck supple with midline trachea   Mouth/Teeth mucous membranes moist, pharynx normal without lesions   Neck supple   Cardiovascular: regular rate and rhythm, no murmurs or  gallops   Respiratory:  appears well, vitals normal, no respiratory distress, acyanotic, normal RR, ear and throat exam is normal, neck free of mass or lymphadenopathy, chest clear, no wheezing, crepitations, rhonchi, normal symmetric air entry   Abdomen: soft, non-tender; bowel sounds normal; no masses,  no organomegaly   Urinary: urethral meatus normal      Assessment:    Pregnancy: G3P1001 Patient Active Problem List   Diagnosis Date Noted  . Rh negative, maternal 10/17/2013  . Thyroid disease in pregnancy 10/14/2013  . Left peroneal tendonitis 07/16/2013        Plan:     Initial labs drawn. Prenatal vitamins. Problem list reviewed and updated. Genetic Screening discussed First Screen: Plans to do genetic testing via MFM.  Ultrasound discussed; fetal survey: results reviewed.  Follow up in 4 weeks. 50% of 30 min visit spent on counseling and coordination of care.   Reviewed practice and routines.    [facility] IncWILLIAMS,Chara Marquard 03/08/2014

## 2014-03-08 NOTE — Patient Instructions (Signed)

## 2014-03-24 ENCOUNTER — Other Ambulatory Visit: Payer: No Typology Code available for payment source

## 2014-03-26 ENCOUNTER — Inpatient Hospital Stay (HOSPITAL_COMMUNITY)
Admission: AD | Admit: 2014-03-26 | Payer: PRIVATE HEALTH INSURANCE | Source: Ambulatory Visit | Admitting: Obstetrics & Gynecology

## 2014-03-30 ENCOUNTER — Telehealth: Payer: Self-pay | Admitting: *Deleted

## 2014-03-30 ENCOUNTER — Encounter: Payer: Self-pay | Admitting: *Deleted

## 2014-03-30 NOTE — Telephone Encounter (Signed)
Pt notified of Harmony results and copy of results emailed to pt.

## 2014-03-30 NOTE — Telephone Encounter (Signed)
Pt notified of Harmony test results.  A copy emailed to pt per her request.

## 2014-03-31 ENCOUNTER — Encounter: Payer: Self-pay | Admitting: Obstetrics & Gynecology

## 2014-03-31 DIAGNOSIS — O0991 Supervision of high risk pregnancy, unspecified, first trimester: Secondary | ICD-10-CM | POA: Insufficient documentation

## 2014-04-04 ENCOUNTER — Ambulatory Visit (INDEPENDENT_AMBULATORY_CARE_PROVIDER_SITE_OTHER): Payer: No Typology Code available for payment source | Admitting: Advanced Practice Midwife

## 2014-04-04 ENCOUNTER — Encounter: Payer: Self-pay | Admitting: Advanced Practice Midwife

## 2014-04-04 VITALS — BP 114/80 | HR 80 | Wt 117.0 lb

## 2014-04-04 DIAGNOSIS — O0991 Supervision of high risk pregnancy, unspecified, first trimester: Secondary | ICD-10-CM

## 2014-04-04 DIAGNOSIS — Z3480 Encounter for supervision of other normal pregnancy, unspecified trimester: Secondary | ICD-10-CM

## 2014-04-04 NOTE — Patient Instructions (Signed)
Second Trimester of Pregnancy The second trimester is from week 13 through week 28, months 4 through 6. The second trimester is often a time when you feel your best. Your body has also adjusted to being pregnant, and you begin to feel better physically. Usually, morning sickness has lessened or quit completely, you may have more energy, and you may have an increase in appetite. The second trimester is also a time when the fetus is growing rapidly. At the end of the sixth month, the fetus is about 9 inches long and weighs about 1 pounds. You will likely begin to feel the baby move (quickening) between 18 and 20 weeks of the pregnancy. BODY CHANGES Your body goes through many changes during pregnancy. The changes vary from woman to woman.   Your weight will continue to increase. You will notice your lower abdomen bulging out.  You may begin to get stretch marks on your hips, abdomen, and breasts.  You may develop headaches that can be relieved by medicines approved by your health care provider.  You may urinate more often because the fetus is pressing on your bladder.  You may develop or continue to have heartburn as a result of your pregnancy.  You may develop constipation because certain hormones are causing the muscles that push waste through your intestines to slow down.  You may develop hemorrhoids or swollen, bulging veins (varicose veins).  You may have back pain because of the weight gain and pregnancy hormones relaxing your joints between the bones in your pelvis and as a result of a shift in weight and the muscles that support your balance.  Your breasts will continue to grow and be tender.  Your gums may bleed and may be sensitive to brushing and flossing.  Dark spots or blotches (chloasma, mask of pregnancy) may develop on your face. This will likely fade after the baby is born.  A dark line from your belly button to the pubic area (linea nigra) may appear. This will likely fade  after the baby is born.  You may have changes in your hair. These can include thickening of your hair, rapid growth, and changes in texture. Some women also have hair loss during or after pregnancy, or hair that feels dry or thin. Your hair will most likely return to normal after your baby is born. WHAT TO EXPECT AT YOUR PRENATAL VISITS During a routine prenatal visit:  You will be weighed to make sure you and the fetus are growing normally.  Your blood pressure will be taken.  Your abdomen will be measured to track your baby's growth.  The fetal heartbeat will be listened to.  Any test results from the previous visit will be discussed. Your health care provider may ask you:  How you are feeling.  If you are feeling the baby move.  If you have had any abnormal symptoms, such as leaking fluid, bleeding, severe headaches, or abdominal cramping.  If you have any questions. Other tests that may be performed during your second trimester include:  Blood tests that check for:  Low iron levels (anemia).  Gestational diabetes (between 24 and 28 weeks).  Rh antibodies.  Urine tests to check for infections, diabetes, or protein in the urine.  An ultrasound to confirm the proper growth and development of the baby.  An amniocentesis to check for possible genetic problems.  Fetal screens for spina bifida and Down syndrome. HOME CARE INSTRUCTIONS   Avoid all smoking, herbs, alcohol, and unprescribed  drugs. These chemicals affect the formation and growth of the baby.  Follow your health care provider's instructions regarding medicine use. There are medicines that are either safe or unsafe to take during pregnancy.  Exercise only as directed by your health care provider. Experiencing uterine cramps is a good sign to stop exercising.  Continue to eat regular, healthy meals.  Wear a good support bra for breast tenderness.  Do not use hot tubs, steam rooms, or saunas.  Wear your  seat belt at all times when driving.  Avoid raw meat, uncooked cheese, cat litter boxes, and soil used by cats. These carry germs that can cause birth defects in the baby.  Take your prenatal vitamins.  Try taking a stool softener (if your health care provider approves) if you develop constipation. Eat more high-fiber foods, such as fresh vegetables or fruit and whole grains. Drink plenty of fluids to keep your urine clear or pale yellow.  Take warm sitz baths to soothe any pain or discomfort caused by hemorrhoids. Use hemorrhoid cream if your health care provider approves.  If you develop varicose veins, wear support hose. Elevate your feet for 15 minutes, 3-4 times a day. Limit salt in your diet.  Avoid heavy lifting, wear low heel shoes, and practice good posture.  Rest with your legs elevated if you have leg cramps or low back pain.  Visit your dentist if you have not gone yet during your pregnancy. Use a soft toothbrush to brush your teeth and be gentle when you floss.  A sexual relationship may be continued unless your health care provider directs you otherwise.  Continue to go to all your prenatal visits as directed by your health care provider. SEEK MEDICAL CARE IF:   You have dizziness.  You have mild pelvic cramps, pelvic pressure, or nagging pain in the abdominal area.  You have persistent nausea, vomiting, or diarrhea.  You have a bad smelling vaginal discharge.  You have pain with urination. SEEK IMMEDIATE MEDICAL CARE IF:   You have a fever.  You are leaking fluid from your vagina.  You have spotting or bleeding from your vagina.  You have severe abdominal cramping or pain.  You have rapid weight gain or loss.  You have shortness of breath with chest pain.  You notice sudden or extreme swelling of your face, hands, ankles, feet, or legs.  You have not felt your baby move in over an hour.  You have severe headaches that do not go away with  medicine.  You have vision changes. Document Released: 08/06/2001 Document Revised: 08/17/2013 Document Reviewed: 10/13/2012 [facility] Patient Information 2015 [place], [place]. This information is not intended to replace advice given to you by your health care provider. Make sure you discuss any questions you have with your health care provider.  Anaphylactic Reaction An anaphylactic reaction is a sudden, severe allergic reaction that involves the whole body. It can be life threatening. A hospital stay is often required. People with asthma, eczema, or hay fever are slightly more likely to have an anaphylactic reaction. CAUSES  An anaphylactic reaction may be caused by anything to which you are allergic. After being exposed to the allergic substance, your immune system becomes sensitized to it. When you are exposed to that allergic substance again, an allergic reaction can occur. Common causes of an anaphylactic reaction include:  Medicines.  Foods, especially peanuts, wheat, shellfish, milk, and eggs.  Insect bites or stings.  Blood products.  Chemicals, such as dyes,  latex, and contrast material used for imaging tests. SYMPTOMS  When an allergic reaction occurs, the body releases histamine and other substances. These substances cause symptoms such as tightening of the airway. Symptoms often develop within seconds or minutes of exposure. Symptoms may include:  Skin rash or hives.  Itching.  Chest tightness.  Swelling of the eyes, tongue, or lips.  Trouble breathing or swallowing.  Lightheadedness or fainting.  Anxiety or confusion.  Stomach pains, vomiting, or diarrhea.  Nasal congestion.  A fast or irregular heartbeat (palpitations). DIAGNOSIS  Diagnosis is based on your history of recent exposure to allergic substances, your symptoms, and a physical exam. Your caregiver may also perform blood or urine tests to confirm the diagnosis. TREATMENT  Epinephrine medicine is the  main treatment for an anaphylactic reaction. Other medicines that may be used for treatment include antihistamines, steroids, and albuterol. In severe cases, fluids and medicine to support blood pressure may be given through an intravenous line (IV). Even if you improve after treatment, you need to be observed to make sure your condition does not get worse. This may require a stay in the hospital. Terry a medical alert bracelet or necklace stating your allergy.  You and your family must learn how to use an anaphylaxis kit or give an epinephrine injection to temporarily treat an emergency allergic reaction. Always carry your epinephrine injection or anaphylaxis kit with you. This can be lifesaving if you have a severe reaction.  Do not drive or perform tasks after treatment until the medicines used to treat your reaction have worn off, or until your caregiver says it is okay.  If you have hives or a rash:  Take medicines as directed by your caregiver.  You may use an over-the-counter antihistamine (diphenhydramine) as needed.  Apply cold compresses to the skin or take baths in cool water. Avoid hot baths or showers. SEEK MEDICAL CARE IF:   You develop symptoms of an allergic reaction to a new substance. Symptoms may start right away or minutes later.  You develop a rash, hives, or itching.  You develop new symptoms. SEEK IMMEDIATE MEDICAL CARE IF:   You have swelling of the mouth, difficulty breathing, or wheezing.  You have a tight feeling in your chest or throat.  You develop hives, swelling, or itching all over your body.  You develop severe vomiting or diarrhea.  You feel faint or pass out. This is an emergency. Use your epinephrine injection or anaphylaxis kit as you have been instructed. Call your local emergency services (911 in [place] Even if you improve after the injection, you need to be examined at a hospital emergency department. MAKE SURE YOU:    Understand these instructions.  Will watch your condition.  Will get help right away if you are not doing well or get worse. Document Released: 08/12/2005 Document Revised: 08/17/2013 Document Reviewed: 11/13/2011 [facility] Patient Information 2015 [place], [place]. This information is not intended to replace advice given to you by your health care provider. Make sure you discuss any questions you have with your health care provider.

## 2014-04-04 NOTE — Progress Notes (Signed)
Hives on arms, abd, inner thighs. No itching. No changes in cosmetics. Not eating usual diet. Starting to feel better. Will food journal. Normal Harmony.

## 2014-04-04 NOTE — Progress Notes (Signed)
Whelps on body

## 2014-05-04 ENCOUNTER — Ambulatory Visit (INDEPENDENT_AMBULATORY_CARE_PROVIDER_SITE_OTHER): Payer: No Typology Code available for payment source | Admitting: Obstetrics & Gynecology

## 2014-05-04 ENCOUNTER — Encounter: Payer: Self-pay | Admitting: Obstetrics & Gynecology

## 2014-05-04 VITALS — BP 118/71 | HR 88 | Wt 122.0 lb

## 2014-05-04 DIAGNOSIS — O0991 Supervision of high risk pregnancy, unspecified, first trimester: Secondary | ICD-10-CM

## 2014-05-04 DIAGNOSIS — O0990 Supervision of high risk pregnancy, unspecified, unspecified trimester: Secondary | ICD-10-CM

## 2014-05-04 NOTE — Progress Notes (Signed)
Routine visit. No problems. Schedule anatomy u/s. She declines a flu vaccine. MSAFP today.

## 2014-05-06 LAB — ALPHA FETOPROTEIN, MATERNAL
AFP: 33.7 ng/mL
Curr Gest Age: 16 wks.days
MOM FOR AFP: 0.9
OPEN SPINA BIFIDA: NEGATIVE

## 2014-05-10 ENCOUNTER — Encounter: Payer: Self-pay | Admitting: Obstetrics & Gynecology

## 2014-05-20 ENCOUNTER — Ambulatory Visit (HOSPITAL_COMMUNITY)
Admission: RE | Admit: 2014-05-20 | Discharge: 2014-05-20 | Disposition: A | Discharge status: Home / Self Care | Payer: No Typology Code available for payment source | Source: Ambulatory Visit | Attending: Obstetrics & Gynecology | Admitting: Obstetrics & Gynecology

## 2014-05-20 DIAGNOSIS — O352XX0 Maternal care for (suspected) hereditary disease in fetus, not applicable or unspecified: Secondary | ICD-10-CM | POA: Insufficient documentation

## 2014-05-20 DIAGNOSIS — O09899 Supervision of other high risk pregnancies, unspecified trimester: Secondary | ICD-10-CM | POA: Diagnosis not present

## 2014-05-20 DIAGNOSIS — O0991 Supervision of high risk pregnancy, unspecified, first trimester: Secondary | ICD-10-CM

## 2014-05-30 ENCOUNTER — Ambulatory Visit (INDEPENDENT_AMBULATORY_CARE_PROVIDER_SITE_OTHER): Payer: No Typology Code available for payment source | Admitting: Advanced Practice Midwife

## 2014-05-30 VITALS — BP 103/66 | HR 73 | Wt 125.0 lb

## 2014-05-30 DIAGNOSIS — O0991 Supervision of high risk pregnancy, unspecified, first trimester: Secondary | ICD-10-CM

## 2014-05-30 NOTE — Progress Notes (Signed)
Anatomy scan normal. Plans to decline GTT and do testing instead. Has supplies. AFP normal.

## 2014-05-30 NOTE — Patient Instructions (Signed)
Second Trimester of Pregnancy The second trimester is from week 13 through week 28, months 4 through 6. The second trimester is often a time when you feel your best. Your body has also adjusted to being pregnant, and you begin to feel better physically. Usually, morning sickness has lessened or quit completely, you may have more energy, and you may have an increase in appetite. The second trimester is also a time when the fetus is growing rapidly. At the end of the sixth month, the fetus is about 9 inches long and weighs about 1 pounds. You will likely begin to feel the baby move (quickening) between 18 and 20 weeks of the pregnancy. BODY CHANGES Your body goes through many changes during pregnancy. The changes vary from woman to woman.   Your weight will continue to increase. You will notice your lower abdomen bulging out.  You may begin to get stretch marks on your hips, abdomen, and breasts.  You may develop headaches that can be relieved by medicines approved by your health care provider.  You may urinate more often because the fetus is pressing on your bladder.  You may develop or continue to have heartburn as a result of your pregnancy.  You may develop constipation because certain hormones are causing the muscles that push waste through your intestines to slow down.  You may develop hemorrhoids or swollen, bulging veins (varicose veins).  You may have back pain because of the weight gain and pregnancy hormones relaxing your joints between the bones in your pelvis and as a result of a shift in weight and the muscles that support your balance.  Your breasts will continue to grow and be tender.  Your gums may bleed and may be sensitive to brushing and flossing.  Dark spots or blotches (chloasma, mask of pregnancy) may develop on your face. This will likely fade after the baby is born.  A dark line from your belly button to the pubic area (linea nigra) may appear. This will likely fade  after the baby is born.  You may have changes in your hair. These can include thickening of your hair, rapid growth, and changes in texture. Some women also have hair loss during or after pregnancy, or hair that feels dry or thin. Your hair will most likely return to normal after your baby is born. WHAT TO EXPECT AT YOUR PRENATAL VISITS During a routine prenatal visit:  You will be weighed to make sure you and the fetus are growing normally.  Your blood pressure will be taken.  Your abdomen will be measured to track your baby's growth.  The fetal heartbeat will be listened to.  Any test results from the previous visit will be discussed. Your health care provider may ask you:  How you are feeling.  If you are feeling the baby move.  If you have had any abnormal symptoms, such as leaking fluid, bleeding, severe headaches, or abdominal cramping.  If you have any questions. Other tests that may be performed during your second trimester include:  Blood tests that check for:  Low iron levels (anemia).  Gestational diabetes (between 24 and 28 weeks).  Rh antibodies.  Urine tests to check for infections, diabetes, or protein in the urine.  An ultrasound to confirm the proper growth and development of the baby.  An amniocentesis to check for possible genetic problems.  Fetal screens for spina bifida and Down syndrome. HOME CARE INSTRUCTIONS   Avoid all smoking, herbs, alcohol, and unprescribed   drugs. These chemicals affect the formation and growth of the baby.  Follow your health care provider's instructions regarding medicine use. There are medicines that are either safe or unsafe to take during pregnancy.  Exercise only as directed by your health care provider. Experiencing uterine cramps is a good sign to stop exercising.  Continue to eat regular, healthy meals.  Wear a good support bra for breast tenderness.  Do not use hot tubs, steam rooms, or saunas.  Wear your  seat belt at all times when driving.  Avoid raw meat, uncooked cheese, cat litter boxes, and soil used by cats. These carry germs that can cause birth defects in the baby.  Take your prenatal vitamins.  Try taking a stool softener (if your health care provider approves) if you develop constipation. Eat more high-fiber foods, such as fresh vegetables or fruit and whole grains. Drink plenty of fluids to keep your urine clear or pale yellow.  Take warm sitz baths to soothe any pain or discomfort caused by hemorrhoids. Use hemorrhoid cream if your health care provider approves.  If you develop varicose veins, wear support hose. Elevate your feet for 15 minutes, 3-4 times a day. Limit salt in your diet.  Avoid heavy lifting, wear low heel shoes, and practice good posture.  Rest with your legs elevated if you have leg cramps or low back pain.  Visit your dentist if you have not gone yet during your pregnancy. Use a soft toothbrush to brush your teeth and be gentle when you floss.  A sexual relationship may be continued unless your health care provider directs you otherwise.  Continue to go to all your prenatal visits as directed by your health care provider. SEEK MEDICAL CARE IF:   You have dizziness.  You have mild pelvic cramps, pelvic pressure, or nagging pain in the abdominal area.  You have persistent nausea, vomiting, or diarrhea.  You have a bad smelling vaginal discharge.  You have pain with urination. SEEK IMMEDIATE MEDICAL CARE IF:   You have a fever.  You are leaking fluid from your vagina.  You have spotting or bleeding from your vagina.  You have severe abdominal cramping or pain.  You have rapid weight gain or loss.  You have shortness of breath with chest pain.  You notice sudden or extreme swelling of your face, hands, ankles, feet, or legs.  You have not felt your baby move in over an hour.  You have severe headaches that do not go away with  medicine.  You have vision changes. Document Released: 08/06/2001 Document Revised: 08/17/2013 Document Reviewed: 10/13/2012 [employer] Patient Information 2015 [employer]. This information is not intended to replace advice given to you by your health care provider. Make sure you discuss any questions you have with your health care provider.  

## 2014-06-27 ENCOUNTER — Ambulatory Visit (INDEPENDENT_AMBULATORY_CARE_PROVIDER_SITE_OTHER): Payer: No Typology Code available for payment source | Admitting: Advanced Practice Midwife

## 2014-06-27 ENCOUNTER — Encounter: Payer: Self-pay | Admitting: Obstetrics & Gynecology

## 2014-06-27 VITALS — BP 107/62 | HR 76 | Wt 126.0 lb

## 2014-06-27 DIAGNOSIS — Z3492 Encounter for supervision of normal pregnancy, unspecified, second trimester: Secondary | ICD-10-CM

## 2014-06-27 NOTE — Progress Notes (Signed)
Feels well. Will start testing sugars at home in a couple of weeks.   Will do FBS and 2 hr PC. No hx diabetes. Wants to avoid glucola

## 2014-06-27 NOTE — Patient Instructions (Signed)
Second Trimester of Pregnancy The second trimester is from week 13 through week 28, months 4 through 6. The second trimester is often a time when you feel your best. Your body has also adjusted to being pregnant, and you begin to feel better physically. Usually, morning sickness has lessened or quit completely, you may have more energy, and you may have an increase in appetite. The second trimester is also a time when the fetus is growing rapidly. At the end of the sixth month, the fetus is about 9 inches long and weighs about 1 pounds. You will likely begin to feel the baby move (quickening) between 18 and 20 weeks of the pregnancy. BODY CHANGES Your body goes through many changes during pregnancy. The changes vary from woman to woman.   Your weight will continue to increase. You will notice your lower abdomen bulging out.  You may begin to get stretch marks on your hips, abdomen, and breasts.  You may develop headaches that can be relieved by medicines approved by your health care provider.  You may urinate more often because the fetus is pressing on your bladder.  You may develop or continue to have heartburn as a result of your pregnancy.  You may develop constipation because certain hormones are causing the muscles that push waste through your intestines to slow down.  You may develop hemorrhoids or swollen, bulging veins (varicose veins).  You may have back pain because of the weight gain and pregnancy hormones relaxing your joints between the bones in your pelvis and as a result of a shift in weight and the muscles that support your balance.  Your breasts will continue to grow and be tender.  Your gums may bleed and may be sensitive to brushing and flossing.  Dark spots or blotches (chloasma, mask of pregnancy) may develop on your face. This will likely fade after the baby is born.  A dark line from your belly button to the pubic area (linea nigra) may appear. This will likely fade  after the baby is born.  You may have changes in your hair. These can include thickening of your hair, rapid growth, and changes in texture. Some women also have hair loss during or after pregnancy, or hair that feels dry or thin. Your hair will most likely return to normal after your baby is born. WHAT TO EXPECT AT YOUR PRENATAL VISITS During a routine prenatal visit:  You will be weighed to make sure you and the fetus are growing normally.  Your blood pressure will be taken.  Your abdomen will be measured to track your baby's growth.  The fetal heartbeat will be listened to.  Any test results from the previous visit will be discussed. Your health care provider may ask you:  How you are feeling.  If you are feeling the baby move.  If you have had any abnormal symptoms, such as leaking fluid, bleeding, severe headaches, or abdominal cramping.  If you have any questions. Other tests that may be performed during your second trimester include:  Blood tests that check for:  Low iron levels (anemia).  Gestational diabetes (between 24 and 28 weeks).  Rh antibodies.  Urine tests to check for infections, diabetes, or protein in the urine.  An ultrasound to confirm the proper growth and development of the baby.  An amniocentesis to check for possible genetic problems.  Fetal screens for spina bifida and Down syndrome. HOME CARE INSTRUCTIONS   Avoid all smoking, herbs, alcohol, and unprescribed   drugs. These chemicals affect the formation and growth of the baby.  Follow your health care provider's instructions regarding medicine use. There are medicines that are either safe or unsafe to take during pregnancy.  Exercise only as directed by your health care provider. Experiencing uterine cramps is a good sign to stop exercising.  Continue to eat regular, healthy meals.  Wear a good support bra for breast tenderness.  Do not use hot tubs, steam rooms, or saunas.  Wear your  seat belt at all times when driving.  Avoid raw meat, uncooked cheese, cat litter boxes, and soil used by cats. These carry germs that can cause birth defects in the baby.  Take your prenatal vitamins.  Try taking a stool softener (if your health care provider approves) if you develop constipation. Eat more high-fiber foods, such as fresh vegetables or fruit and whole grains. Drink plenty of fluids to keep your urine clear or pale yellow.  Take warm sitz baths to soothe any pain or discomfort caused by hemorrhoids. Use hemorrhoid cream if your health care provider approves.  If you develop varicose veins, wear support hose. Elevate your feet for 15 minutes, 3-4 times a day. Limit salt in your diet.  Avoid heavy lifting, wear low heel shoes, and practice good posture.  Rest with your legs elevated if you have leg cramps or low back pain.  Visit your dentist if you have not gone yet during your pregnancy. Use a soft toothbrush to brush your teeth and be gentle when you floss.  A sexual relationship may be continued unless your health care provider directs you otherwise.  Continue to go to all your prenatal visits as directed by your health care provider. SEEK MEDICAL CARE IF:   You have dizziness.  You have mild pelvic cramps, pelvic pressure, or nagging pain in the abdominal area.  You have persistent nausea, vomiting, or diarrhea.  You have a bad smelling vaginal discharge.  You have pain with urination. SEEK IMMEDIATE MEDICAL CARE IF:   You have a fever.  You are leaking fluid from your vagina.  You have spotting or bleeding from your vagina.  You have severe abdominal cramping or pain.  You have rapid weight gain or loss.  You have shortness of breath with chest pain.  You notice sudden or extreme swelling of your face, hands, ankles, feet, or legs.  You have not felt your baby move in over an hour.  You have severe headaches that do not go away with  medicine.  You have vision changes. Document Released: 08/06/2001 Document Revised: 08/17/2013 Document Reviewed: 10/13/2012 [employer] Patient Information 2015 [employer]. This information is not intended to replace advice given to you by your health care provider. Make sure you discuss any questions you have with your health care provider.  

## 2014-06-27 NOTE — Progress Notes (Signed)
Rt inguinal hernia

## 2014-07-25 ENCOUNTER — Ambulatory Visit (INDEPENDENT_AMBULATORY_CARE_PROVIDER_SITE_OTHER): Payer: No Typology Code available for payment source | Admitting: Advanced Practice Midwife

## 2014-07-25 VITALS — BP 109/64 | HR 69 | Wt 130.0 lb

## 2014-07-25 DIAGNOSIS — Z3492 Encounter for supervision of normal pregnancy, unspecified, second trimester: Secondary | ICD-10-CM

## 2014-07-25 NOTE — Progress Notes (Signed)
Doing well.  Good fetal movement, denies vaginal bleeding, LOF, regular contractions.  Plans CBC or H&H at her husband's office, will bring records.  Reviewed glucose log, all normal range, fasting and PP.  Pt taking fasting and 1 PP, varying which meal, daily.  She has no hx or risk factors for diabetes but does not desire glucose test.  Anticipatory guidance given about upcoming visits.

## 2014-08-01 ENCOUNTER — Ambulatory Visit (INDEPENDENT_AMBULATORY_CARE_PROVIDER_SITE_OTHER): Payer: No Typology Code available for payment source | Admitting: Sports Medicine

## 2014-08-01 ENCOUNTER — Encounter: Payer: Self-pay | Admitting: Sports Medicine

## 2014-08-01 VITALS — BP 112/65 | HR 56 | Wt 132.0 lb

## 2014-08-01 DIAGNOSIS — S9305XA Dislocation of left ankle joint, initial encounter: Secondary | ICD-10-CM

## 2014-08-01 DIAGNOSIS — IMO0001 Reserved for inherently not codable concepts without codable children: Secondary | ICD-10-CM

## 2014-08-01 NOTE — Progress Notes (Signed)
  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 Pain is mild, persistent. Melanie Johnston is pregnant, they do have a trip coming up next year in January, she does want aggressive treatment and immobilization of necessary for this to heal.  Past medical history, Surgical history, Family history not pertinant except as noted below, Social history, Allergies, and medications have been entered into the medical record, reviewed, and no changes needed.   Review of Systems: No fevers, chills, night sweats, weight loss, chest pain, or shortness of breath.   Objective:    General: Well Developed, well nourished, and in no acute distress.  Neuro: Alert and oriented x3, extra-ocular muscles intact, sensation grossly intact.  HEENT: Normocephalic, atraumatic, pupils equal round reactive to light, neck supple, no masses, no lymphadenopathy, thyroid nonpalpable.  Skin: Warm and dry, no rashes. Cardiac: Regular rate and rhythm, no murmurs rubs or gallops, no lower extremity edema.  Respiratory: Clear to auscultation bilaterally. Not using accessory muscles, speaking in full sentences. Left Ankle: Visible swelling with mild bruising behind the lateral malleolus. Range of motion is full in all directions. Strength is 5/5 in all directions. Stable lateral and medial ligaments; squeeze test and kleiger test unremarkable; Talar dome nontender; No pain at base of 5th MT; No tenderness over cuboid; No tenderness over N spot or navicular prominence No tenderness on posterior aspects of lateral and medial malleolus No sign of peroneal tendon subluxations; I'm unable to reproduce the peroneal tendon subluxations. Negative tarsal  tunnel tinel's Able to walk 4 steps.  Short leg cast with a walker placed.  Impression and Recommendations:

## 2014-08-01 NOTE — Assessment & Plan Note (Addendum)
Short-leg cast with walker as above. She does have a trip coming up in January, we are going to start with aggressive immobilization so that the peroneal retinaculum can heal. Return in 3 weeks for cast removal.

## 2014-08-12 ENCOUNTER — Ambulatory Visit (INDEPENDENT_AMBULATORY_CARE_PROVIDER_SITE_OTHER): Payer: No Typology Code available for payment source | Admitting: Family

## 2014-08-12 VITALS — BP 101/67 | HR 71 | Wt 131.0 lb

## 2014-08-12 DIAGNOSIS — O99283 Endocrine, nutritional and metabolic diseases complicating pregnancy, third trimester: Secondary | ICD-10-CM

## 2014-08-12 DIAGNOSIS — O0991 Supervision of high risk pregnancy, unspecified, first trimester: Secondary | ICD-10-CM

## 2014-08-12 DIAGNOSIS — E079 Disorder of thyroid, unspecified: Secondary | ICD-10-CM

## 2014-08-12 NOTE — Progress Notes (Signed)
Doing well; brought in H&H results from office; results 12.9 and 38.5 .  Plans for waterbirth, attended class in December.  Bernette RedbirdKenny, doula present at visit.

## 2014-08-15 ENCOUNTER — Encounter: Payer: Self-pay | Admitting: *Deleted

## 2014-08-17 ENCOUNTER — Encounter: Payer: Self-pay | Admitting: Sports Medicine

## 2014-08-17 ENCOUNTER — Ambulatory Visit (INDEPENDENT_AMBULATORY_CARE_PROVIDER_SITE_OTHER): Payer: No Typology Code available for payment source | Admitting: Sports Medicine

## 2014-08-17 VITALS — BP 102/63 | HR 69 | Wt 133.0 lb

## 2014-08-17 DIAGNOSIS — IMO0001 Reserved for inherently not codable concepts without codable children: Secondary | ICD-10-CM

## 2014-08-17 DIAGNOSIS — S9305XD Dislocation of left ankle joint, subsequent encounter: Secondary | ICD-10-CM

## 2014-08-17 NOTE — Progress Notes (Signed)
  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 She has a distant history of a left ankle fracture dislocation during cheerleading, subsequently she's had some difficulty with peroneal tendon subluxations recently, we placed her in a short leg cast for review weeks, and she returns today with complete resolution of symptoms.  Past medical history, Surgical history, Family history not pertinant except as noted below, Social history, Allergies, and medications have been entered into the medical record, reviewed, and no changes needed.   Review of Systems: No fevers, chills, night sweats, weight loss, chest pain, or shortness of breath.   Objective:    General: Well Developed, well nourished, and in no acute distress.  Neuro: Alert and oriented x3, extra-ocular muscles intact, sensation grossly intact.  HEENT: Normocephalic, atraumatic, pupils equal round reactive to light, neck supple, no masses, no lymphadenopathy, thyroid nonpalpable.  Skin: Warm and dry, no rashes. Cardiac: Regular rate and rhythm, no murmurs rubs or gallops, no lower extremity edema.  Respiratory: Clear to auscultation bilaterally. Not using accessory muscles, speaking in full sentences. Left Ankle: Cast is removed. No visible erythema or swelling. Range of motion is full in all directions. Strength is 5/5 in all directions. Stable lateral and medial ligaments; squeeze test and kleiger test unremarkable; Talar dome nontender; No pain at base of 5th MT; No tenderness over cuboid; No tenderness over N spot or navicular prominence No tenderness on posterior aspects of lateral and medial malleolus No sign of peroneal tendon subluxations; Negative tarsal tunnel tinel's Able to walk 4 steps.  Impression and Recommendations:

## 2014-08-17 NOTE — Assessment & Plan Note (Signed)
Melanie Johnston returns, she is doing extremely well, we removed her cast today. On exam I was unable to reproduce any peroneal tendon subluxations suggesting healing of the peroneal tendon retinaculum. Continue boot intermittently for 2 weeks, peroneal rehabilitation exercises. Return as needed.

## 2014-08-22 ENCOUNTER — Ambulatory Visit: Payer: No Typology Code available for payment source | Admitting: Sports Medicine

## 2014-08-26 NOTE — L&D Delivery Note (Signed)
Delivery Note At 2:19 PM a viable female was delivered via Vaginal, Spontaneous Delivery (Presentation: Left Occiput Posterior).  APGAR: 8, 9; weight  .   Placenta status: Intact, Spontaneous.  Cord: 3 vessels with the following complications: None.    Anesthesia: Local  Episiotomy: None Lacerations:  2nd degree perineal  Suture Repair: 3.0 vicryl Est. Blood Loss (mL):    Mom to postpartum.  Baby to Couplet care / Skin to Skin.  Melanie CrookHogan, Melanie Johnston 10/13/2014, 2:50 PM

## 2014-09-05 ENCOUNTER — Ambulatory Visit (INDEPENDENT_AMBULATORY_CARE_PROVIDER_SITE_OTHER): Payer: No Typology Code available for payment source | Admitting: Advanced Practice Midwife

## 2014-09-05 VITALS — BP 115/70 | HR 71 | Wt 131.0 lb

## 2014-09-05 DIAGNOSIS — O0991 Supervision of high risk pregnancy, unspecified, first trimester: Secondary | ICD-10-CM

## 2014-09-05 DIAGNOSIS — Z3483 Encounter for supervision of other normal pregnancy, third trimester: Secondary | ICD-10-CM

## 2014-09-05 NOTE — Patient Instructions (Signed)
Braxton Hicks Contractions °Contractions of the uterus can occur throughout pregnancy. Contractions are not always a sign that you are in labor.  °WHAT ARE BRAXTON HICKS CONTRACTIONS?  °Contractions that occur before labor are called Braxton Hicks contractions, or false labor. Toward the end of pregnancy (32-34 weeks), these contractions can develop more often and may become more forceful. This is not true labor because these contractions do not result in opening (dilatation) and thinning of the cervix. They are sometimes difficult to tell apart from true labor because these contractions can be forceful and people have different pain tolerances. You should not feel embarrassed if you go to the hospital with false labor. Sometimes, the only way to tell if you are in true labor is for your health care provider to look for changes in the cervix. °If there are no prenatal problems or other health problems associated with the pregnancy, it is completely safe to be sent home with false labor and await the onset of true labor. °HOW CAN YOU TELL THE DIFFERENCE BETWEEN TRUE AND FALSE LABOR? °False Labor °· The contractions of false labor are usually shorter and not as hard as those of true labor.   °· The contractions are usually irregular.   °· The contractions are often felt in the front of the lower abdomen and in the groin.   °· The contractions may go away when you walk around or change positions while lying down.   °· The contractions get weaker and are shorter lasting as time goes on.   °· The contractions do not usually become progressively stronger, regular, and closer together as with true labor.   °True Labor °· Contractions in true labor last 30-70 seconds, become very regular, usually become more intense, and increase in frequency.   °· The contractions do not go away with walking.   °· The discomfort is usually felt in the top of the uterus and spreads to the lower abdomen and low back.   °· True labor can be  determined by your health care provider with an exam. This will show that the cervix is dilating and getting thinner.   °WHAT TO REMEMBER °· Keep up with your usual exercises and follow other instructions given by your health care provider.   °· Take medicines as directed by your health care provider.   °· Keep your regular prenatal appointments.   °· Eat and drink lightly if you think you are going into labor.   °· If Braxton Hicks contractions are making you uncomfortable:   °¨ Change your position from lying down or resting to walking, or from walking to resting.   °¨ Sit and rest in a tub of warm water.   °¨ Drink 2-3 glasses of water. Dehydration may cause these contractions.   °¨ Do slow and deep breathing several times an hour.   °WHEN SHOULD I SEEK IMMEDIATE MEDICAL CARE? °Seek immediate medical care if: °· Your contractions become stronger, more regular, and closer together.   °· You have fluid leaking or gushing from your vagina.   °· You have a fever.   °· You pass blood-tinged mucus.   °· You have vaginal bleeding.   °· You have continuous abdominal pain.   °· You have low back pain that you never had before.   °· You feel your baby's head pushing down and causing pelvic pressure.   °· Your baby is not moving as much as it used to.   °Document Released: 08/12/2005 Document Revised: 08/17/2013 Document Reviewed: 05/24/2013 °[employer]® Patient Information ©2015 [employer]. This information is not intended to replace advice given to you by your health care   provider. Make sure you discuss any questions you have with your health care provider. ° °

## 2014-09-05 NOTE — Progress Notes (Signed)
Discussed weight gain. Pt able to eat well. Good, very healthy diet. Has been in cast x 5 weeks for ankle injury. Watch fundal.

## 2014-09-19 ENCOUNTER — Ambulatory Visit (INDEPENDENT_AMBULATORY_CARE_PROVIDER_SITE_OTHER): Payer: No Typology Code available for payment source | Admitting: Advanced Practice Midwife

## 2014-09-19 ENCOUNTER — Other Ambulatory Visit: Payer: Self-pay | Admitting: Advanced Practice Midwife

## 2014-09-19 VITALS — BP 116/71 | HR 60 | Wt 133.0 lb

## 2014-09-19 DIAGNOSIS — Z36 Encounter for antenatal screening of mother: Secondary | ICD-10-CM

## 2014-09-19 DIAGNOSIS — Z3403 Encounter for supervision of normal first pregnancy, third trimester: Secondary | ICD-10-CM

## 2014-09-19 DIAGNOSIS — Z6711 Type A blood, Rh negative: Secondary | ICD-10-CM

## 2014-09-19 DIAGNOSIS — O360931 Maternal care for other rhesus isoimmunization, third trimester, fetus 1: Secondary | ICD-10-CM

## 2014-09-19 DIAGNOSIS — O0991 Supervision of high risk pregnancy, unspecified, first trimester: Secondary | ICD-10-CM

## 2014-09-19 MED ORDER — RHO D IMMUNE GLOBULIN 1500 UNIT/2ML IJ SOSY
300.0000 ug | PREFILLED_SYRINGE | Freq: Once | INTRAMUSCULAR | Status: AC
  Administered 2014-09-19: 300 ug via INTRAMUSCULAR

## 2014-09-20 LAB — GC/CHLAMYDIA PROBE AMP
CT Probe RNA: NEGATIVE
GC Probe RNA: NEGATIVE

## 2014-09-21 ENCOUNTER — Encounter: Payer: Self-pay | Admitting: Advanced Practice Midwife

## 2014-09-21 LAB — CULTURE, BETA STREP (GROUP B ONLY)

## 2014-09-21 NOTE — Patient Instructions (Signed)
Third Trimester of Pregnancy The third trimester is from week 29 through week 42, months 7 through 9. The third trimester is a time when the fetus is growing rapidly. At the end of the ninth month, the fetus is about 20 inches in length and weighs 6-10 pounds.  BODY CHANGES Your body goes through many changes during pregnancy. The changes vary from woman to woman.   Your weight will continue to increase. You can expect to gain 25-35 pounds (11-16 kg) by the end of the pregnancy.  You may begin to get stretch marks on your hips, abdomen, and breasts.  You may urinate more often because the fetus is moving lower into your pelvis and pressing on your bladder.  You may develop or continue to have heartburn as a result of your pregnancy.  You may develop constipation because certain hormones are causing the muscles that push waste through your intestines to slow down.  You may develop hemorrhoids or swollen, bulging veins (varicose veins).  You may have pelvic pain because of the weight gain and pregnancy hormones relaxing your joints between the bones in your pelvis. Backaches may result from overexertion of the muscles supporting your posture.  You may have changes in your hair. These can include thickening of your hair, rapid growth, and changes in texture. Some women also have hair loss during or after pregnancy, or hair that feels dry or thin. Your hair will most likely return to normal after your baby is born.  Your breasts will continue to grow and be tender. A yellow discharge may leak from your breasts called colostrum.  Your belly button may stick out.  You may feel short of breath because of your expanding uterus.  You may notice the fetus "dropping," or moving lower in your abdomen.  You may have a bloody mucus discharge. This usually occurs a few days to a week before labor begins.  Your cervix becomes thin and soft (effaced) near your due date. WHAT TO EXPECT AT YOUR PRENATAL  EXAMS  You will have prenatal exams every 2 weeks until week 36. Then, you will have weekly prenatal exams. During a routine prenatal visit:  You will be weighed to make sure you and the fetus are growing normally.  Your blood pressure is taken.  Your abdomen will be measured to track your baby's growth.  The fetal heartbeat will be listened to.  Any test results from the previous visit will be discussed.  You may have a cervical check near your due date to see if you have effaced. At around 36 weeks, your caregiver will check your cervix. At the same time, your caregiver will also perform a test on the secretions of the vaginal tissue. This test is to determine if a type of bacteria, Group B streptococcus, is present. Your caregiver will explain this further. Your caregiver may ask you:  What your birth plan is.  How you are feeling.  If you are feeling the baby move.  If you have had any abnormal symptoms, such as leaking fluid, bleeding, severe headaches, or abdominal cramping.  If you have any questions. Other tests or screenings that may be performed during your third trimester include:  Blood tests that check for low iron levels (anemia).  Fetal testing to check the health, activity level, and growth of the fetus. Testing is done if you have certain medical conditions or if there are problems during the pregnancy. FALSE LABOR You may feel small, irregular contractions that   eventually go away. These are called Braxton Hicks contractions, or false labor. Contractions may last for hours, days, or even weeks before true labor sets in. If contractions come at regular intervals, intensify, or become painful, it is best to be seen by your caregiver.  SIGNS OF LABOR   Menstrual-like cramps.  Contractions that are 5 minutes apart or less.  Contractions that start on the top of the uterus and spread down to the lower abdomen and back.  A sense of increased pelvic pressure or back  pain.  A watery or bloody mucus discharge that comes from the vagina. If you have any of these signs before the 37th week of pregnancy, call your caregiver right away. You need to go to the hospital to get checked immediately. HOME CARE INSTRUCTIONS   Avoid all smoking, herbs, alcohol, and unprescribed drugs. These chemicals affect the formation and growth of the baby.  Follow your caregiver's instructions regarding medicine use. There are medicines that are either safe or unsafe to take during pregnancy.  Exercise only as directed by your caregiver. Experiencing uterine cramps is a good sign to stop exercising.  Continue to eat regular, healthy meals.  Wear a good support bra for breast tenderness.  Do not use hot tubs, steam rooms, or saunas.  Wear your seat belt at all times when driving.  Avoid raw meat, uncooked cheese, cat litter boxes, and soil used by cats. These carry germs that can cause birth defects in the baby.  Take your prenatal vitamins.  Try taking a stool softener (if your caregiver approves) if you develop constipation. Eat more high-fiber foods, such as fresh vegetables or fruit and whole grains. Drink plenty of fluids to keep your urine clear or pale yellow.  Take warm sitz baths to soothe any pain or discomfort caused by hemorrhoids. Use hemorrhoid cream if your caregiver approves.  If you develop varicose veins, wear support hose. Elevate your feet for 15 minutes, 3-4 times a day. Limit salt in your diet.  Avoid heavy lifting, wear low heal shoes, and practice good posture.  Rest a lot with your legs elevated if you have leg cramps or low back pain.  Visit your dentist if you have not gone during your pregnancy. Use a soft toothbrush to brush your teeth and be gentle when you floss.  A sexual relationship may be continued unless your caregiver directs you otherwise.  Do not travel far distances unless it is absolutely necessary and only with the approval  of your caregiver.  Take prenatal classes to understand, practice, and ask questions about the labor and delivery.  Make a trial run to the hospital.  Pack your hospital bag.  Prepare the baby's nursery.  Continue to go to all your prenatal visits as directed by your caregiver. SEEK MEDICAL CARE IF:  You are unsure if you are in labor or if your water has broken.  You have dizziness.  You have mild pelvic cramps, pelvic pressure, or nagging pain in your abdominal area.  You have persistent nausea, vomiting, or diarrhea.  You have a bad smelling vaginal discharge.  You have pain with urination. SEEK IMMEDIATE MEDICAL CARE IF:   You have a fever.  You are leaking fluid from your vagina.  You have spotting or bleeding from your vagina.  You have severe abdominal cramping or pain.  You have rapid weight loss or gain.  You have shortness of breath with chest pain.  You notice sudden or extreme swelling   of your face, hands, ankles, feet, or legs.  You have not felt your baby move in over an hour.  You have severe headaches that do not go away with medicine.  You have vision changes. Document Released: 08/06/2001 Document Revised: 08/17/2013 Document Reviewed: 10/13/2012 [employer] Patient Information 2015 [employer]. This information is not intended to replace advice given to you by your health care provider. Make sure you discuss any questions you have with your health care provider.  

## 2014-09-21 NOTE — Progress Notes (Signed)
Cultures and GBS done.  Noted that she did not get Rhophylac at 28 weeks so given today

## 2014-09-26 ENCOUNTER — Ambulatory Visit (INDEPENDENT_AMBULATORY_CARE_PROVIDER_SITE_OTHER): Payer: No Typology Code available for payment source | Admitting: Advanced Practice Midwife

## 2014-09-26 VITALS — BP 112/71 | HR 70 | Wt 133.0 lb

## 2014-09-26 DIAGNOSIS — O2613 Low weight gain in pregnancy, third trimester: Secondary | ICD-10-CM

## 2014-09-26 DIAGNOSIS — O0991 Supervision of high risk pregnancy, unspecified, first trimester: Secondary | ICD-10-CM

## 2014-09-26 NOTE — Progress Notes (Signed)
GBS neg. Watch weight. Able to eat well. Declined growth US.

## 2014-09-26 NOTE — Patient Instructions (Signed)
Braxton Hicks Contractions °Contractions of the uterus can occur throughout pregnancy. Contractions are not always a sign that you are in labor.  °WHAT ARE BRAXTON HICKS CONTRACTIONS?  °Contractions that occur before labor are called Braxton Hicks contractions, or false labor. Toward the end of pregnancy (32-34 weeks), these contractions can develop more often and may become more forceful. This is not true labor because these contractions do not result in opening (dilatation) and thinning of the cervix. They are sometimes difficult to tell apart from true labor because these contractions can be forceful and people have different pain tolerances. You should not feel embarrassed if you go to the hospital with false labor. Sometimes, the only way to tell if you are in true labor is for your health care provider to look for changes in the cervix. °If there are no prenatal problems or other health problems associated with the pregnancy, it is completely safe to be sent home with false labor and await the onset of true labor. °HOW CAN YOU TELL THE DIFFERENCE BETWEEN TRUE AND FALSE LABOR? °False Labor °· The contractions of false labor are usually shorter and not as hard as those of true labor.   °· The contractions are usually irregular.   °· The contractions are often felt in the front of the lower abdomen and in the groin.   °· The contractions may go away when you walk around or change positions while lying down.   °· The contractions get weaker and are shorter lasting as time goes on.   °· The contractions do not usually become progressively stronger, regular, and closer together as with true labor.   °True Labor °1. Contractions in true labor last 30-70 seconds, become very regular, usually become more intense, and increase in frequency.   °2. The contractions do not go away with walking.   °3. The discomfort is usually felt in the top of the uterus and spreads to the lower abdomen and low back.   °4. True labor can  be determined by your health care provider with an exam. This will show that the cervix is dilating and getting thinner.   °WHAT TO REMEMBER °· Keep up with your usual exercises and follow other instructions given by your health care provider.   °· Take medicines as directed by your health care provider.   °· Keep your regular prenatal appointments.   °· Eat and drink lightly if you think you are going into labor.   °· If Braxton Hicks contractions are making you uncomfortable:   °· Change your position from lying down or resting to walking, or from walking to resting.   °· Sit and rest in a tub of warm water.   °· Drink 2-3 glasses of water. Dehydration may cause these contractions.   °· Do slow and deep breathing several times an hour.   °WHEN SHOULD I SEEK IMMEDIATE MEDICAL CARE? °Seek immediate medical care if: °· Your contractions become stronger, more regular, and closer together.   °· You have fluid leaking or gushing from your vagina.   °· You have a fever.   °· You pass blood-tinged mucus.   °· You have vaginal bleeding.   °· You have continuous abdominal pain.   °· You have low back pain that you never had before.   °· You feel your baby's head pushing down and causing pelvic pressure.   °· Your baby is not moving as much as it used to.   °Document Released: 08/12/2005 Document Revised: 08/17/2013 Document Reviewed: 05/24/2013 °ExitCare® Patient Information ©2015 [employer]. This information is not intended to replace advice given to you by your health care   provider. Make sure you discuss any questions you have with your health care provider. ° °Fetal Movement Counts °Patient Name: __________________________________________________ Patient Due Date: ____________________ °Performing a fetal movement count is highly recommended in high-risk pregnancies, but it is good for every pregnant woman to do. Your health care provider may ask you to start counting fetal movements at 28 weeks of the pregnancy. Fetal  movements often increase: °· After eating a full meal. °· After physical activity. °· After eating or drinking something sweet or cold. °· At rest. °Pay attention to when you feel the baby is most active. This will help you notice a pattern of your baby's sleep and wake cycles and what factors contribute to an increase in fetal movement. It is important to perform a fetal movement count at the same time each day when your baby is normally most active.  °HOW TO COUNT FETAL MOVEMENTS °5. Find a quiet and comfortable area to sit or lie down on your left side. Lying on your left side provides the best blood and oxygen circulation to your baby. °6. Write down the day and time on a sheet of paper or in a journal. °7. Start counting kicks, flutters, swishes, rolls, or jabs in a 2-hour period. You should feel at least 10 movements within 2 hours. °8. If you do not feel 10 movements in 2 hours, wait 2-3 hours and count again. Look for a change in the pattern or not enough counts in 2 hours. °SEEK MEDICAL CARE IF: °· You feel less than 10 counts in 2 hours, tried twice. °· There is no movement in over an hour. °· The pattern is changing or taking longer each day to reach 10 counts in 2 hours. °· You feel the baby is not moving as he or she usually does. °Date: ____________ Movements: ____________ Start time: ____________ Finish time: ____________  °Date: ____________ Movements: ____________ Start time: ____________ Finish time: ____________ °Date: ____________ Movements: ____________ Start time: ____________ Finish time: ____________ °Date: ____________ Movements: ____________ Start time: ____________ Finish time: ____________ °Date: ____________ Movements: ____________ Start time: ____________ Finish time: ____________ °Date: ____________ Movements: ____________ Start time: ____________ Finish time: ____________ °Date: ____________ Movements: ____________ Start time: ____________ Finish time: ____________ °Date: ____________  Movements: ____________ Start time: ____________ Finish time: ____________  °Date: ____________ Movements: ____________ Start time: ____________ Finish time: ____________ °Date: ____________ Movements: ____________ Start time: ____________ Finish time: ____________ °Date: ____________ Movements: ____________ Start time: ____________ Finish time: ____________ °Date: ____________ Movements: ____________ Start time: ____________ Finish time: ____________ °Date: ____________ Movements: ____________ Start time: ____________ Finish time: ____________ °Date: ____________ Movements: ____________ Start time: ____________ Finish time: ____________ °Date: ____________ Movements: ____________ Start time: ____________ Finish time: ____________  °Date: ____________ Movements: ____________ Start time: ____________ Finish time: ____________ °Date: ____________ Movements: ____________ Start time: ____________ Finish time: ____________ °Date: ____________ Movements: ____________ Start time: ____________ Finish time: ____________ °Date: ____________ Movements: ____________ Start time: ____________ Finish time: ____________ °Date: ____________ Movements: ____________ Start time: ____________ Finish time: ____________ °Date: ____________ Movements: ____________ Start time: ____________ Finish time: ____________ °Date: ____________ Movements: ____________ Start time: ____________ Finish time: ____________  °Date: ____________ Movements: ____________ Start time: ____________ Finish time: ____________ °Date: ____________ Movements: ____________ Start time: ____________ Finish time: ____________ °Date: ____________ Movements: ____________ Start time: ____________ Finish time: ____________ °Date: ____________ Movements: ____________ Start time: ____________ Finish time: ____________ °Date: ____________ Movements: ____________ Start time: ____________ Finish time: ____________ °Date: ____________ Movements: ____________ Start time:  ____________ Finish time: ____________ °Date: ____________ Movements:   ____________ Start time: ____________ Finish time: ____________  °Date: ____________ Movements: ____________ Start time: ____________ Finish time: ____________ °Date: ____________ Movements: ____________ Start time: ____________ Finish time: ____________ °Date: ____________ Movements: ____________ Start time: ____________ Finish time: ____________ °Date: ____________ Movements: ____________ Start time: ____________ Finish time: ____________ °Date: ____________ Movements: ____________ Start time: ____________ Finish time: ____________ °Date: ____________ Movements: ____________ Start time: ____________ Finish time: ____________ °Date: ____________ Movements: ____________ Start time: ____________ Finish time: ____________  °Date: ____________ Movements: ____________ Start time: ____________ Finish time: ____________ °Date: ____________ Movements: ____________ Start time: ____________ Finish time: ____________ °Date: ____________ Movements: ____________ Start time: ____________ Finish time: ____________ °Date: ____________ Movements: ____________ Start time: ____________ Finish time: ____________ °Date: ____________ Movements: ____________ Start time: ____________ Finish time: ____________ °Date: ____________ Movements: ____________ Start time: ____________ Finish time: ____________ °Date: ____________ Movements: ____________ Start time: ____________ Finish time: ____________  °Date: ____________ Movements: ____________ Start time: ____________ Finish time: ____________ °Date: ____________ Movements: ____________ Start time: ____________ Finish time: ____________ °Date: ____________ Movements: ____________ Start time: ____________ Finish time: ____________ °Date: ____________ Movements: ____________ Start time: ____________ Finish time: ____________ °Date: ____________ Movements: ____________ Start time: ____________ Finish time: ____________ °Date:  ____________ Movements: ____________ Start time: ____________ Finish time: ____________ °Date: ____________ Movements: ____________ Start time: ____________ Finish time: ____________  °Date: ____________ Movements: ____________ Start time: ____________ Finish time: ____________ °Date: ____________ Movements: ____________ Start time: ____________ Finish time: ____________ °Date: ____________ Movements: ____________ Start time: ____________ Finish time: ____________ °Date: ____________ Movements: ____________ Start time: ____________ Finish time: ____________ °Date: ____________ Movements: ____________ Start time: ____________ Finish time: ____________ °Date: ____________ Movements: ____________ Start time: ____________ Finish time: ____________ °Document Released: 09/11/2006 Document Revised: 12/27/2013 Document Reviewed: 06/08/2012 °ExitCare® Patient Information ©2015 [employer]. This information is not intended to replace advice given to you by your health care provider. Make sure you discuss any questions you have with your health care provider. ° °

## 2014-10-03 ENCOUNTER — Ambulatory Visit (INDEPENDENT_AMBULATORY_CARE_PROVIDER_SITE_OTHER): Payer: No Typology Code available for payment source | Admitting: Advanced Practice Midwife

## 2014-10-03 VITALS — BP 113/72 | HR 72 | Wt 136.0 lb

## 2014-10-03 DIAGNOSIS — Z3493 Encounter for supervision of normal pregnancy, unspecified, third trimester: Secondary | ICD-10-CM

## 2014-10-03 NOTE — Progress Notes (Signed)
Pt wants cervical check today 

## 2014-10-04 ENCOUNTER — Encounter: Payer: Self-pay | Admitting: Advanced Practice Midwife

## 2014-10-04 NOTE — Progress Notes (Signed)
Doing well.  Labor precautions reviewed.

## 2014-10-04 NOTE — Patient Instructions (Signed)

## 2014-10-10 ENCOUNTER — Encounter: Payer: No Typology Code available for payment source | Admitting: Advanced Practice Midwife

## 2014-10-13 ENCOUNTER — Encounter (HOSPITAL_COMMUNITY): Payer: Self-pay | Admitting: Emergency Medicine

## 2014-10-13 ENCOUNTER — Inpatient Hospital Stay (HOSPITAL_COMMUNITY)
Admission: AD | Admit: 2014-10-13 | Discharge: 2014-10-14 | DRG: 775 | Disposition: A | Discharge status: Home / Self Care | Payer: No Typology Code available for payment source | Source: Ambulatory Visit | Attending: Obstetrics & Gynecology | Admitting: Obstetrics & Gynecology

## 2014-10-13 DIAGNOSIS — Z3A39 39 weeks gestation of pregnancy: Secondary | ICD-10-CM | POA: Diagnosis present

## 2014-10-13 DIAGNOSIS — E230 Hypopituitarism: Secondary | ICD-10-CM | POA: Diagnosis present

## 2014-10-13 DIAGNOSIS — O99283 Endocrine, nutritional and metabolic diseases complicating pregnancy, third trimester: Secondary | ICD-10-CM | POA: Diagnosis present

## 2014-10-13 DIAGNOSIS — O9989 Other specified diseases and conditions complicating pregnancy, childbirth and the puerperium: Secondary | ICD-10-CM | POA: Diagnosis present

## 2014-10-13 DIAGNOSIS — E079 Disorder of thyroid, unspecified: Secondary | ICD-10-CM | POA: Diagnosis present

## 2014-10-13 LAB — CBC
HCT: 36.3 % (ref 36.0–46.0)
HEMOGLOBIN: 12.7 g/dL (ref 12.0–15.0)
MCH: 32.9 pg (ref 26.0–34.0)
MCHC: 35 g/dL (ref 30.0–36.0)
MCV: 94 fL (ref 78.0–100.0)
Platelets: 144 10*3/uL — ABNORMAL LOW (ref 150–400)
RBC: 3.86 MIL/uL — AB (ref 3.87–5.11)
RDW: 12.9 % (ref 11.5–15.5)
WBC: 12 10*3/uL — ABNORMAL HIGH (ref 4.0–10.5)

## 2014-10-13 LAB — RAPID HIV SCREEN (WH-MAU): SUDS RAPID HIV SCREEN: NONREACTIVE

## 2014-10-13 MED ORDER — OXYCODONE-ACETAMINOPHEN 5-325 MG PO TABS: 2.0000 | ORAL_TABLET | ORAL | Status: DC | PRN

## 2014-10-13 MED ORDER — THYROID 30 MG PO TABS
30.0000 mg | ORAL_TABLET | Freq: Every day | ORAL | Status: DC
  Filled 2014-10-13 (×2): qty 1

## 2014-10-13 MED ORDER — PRENATAL MULTIVITAMIN CH
1.0000 | ORAL_TABLET | Freq: Every day | ORAL | Status: DC
  Filled 2014-10-13: qty 1

## 2014-10-13 MED ORDER — DIBUCAINE 1 % RE OINT: 1.0000 "application " | TOPICAL_OINTMENT | RECTAL | Status: DC | PRN

## 2014-10-13 MED ORDER — OXYCODONE-ACETAMINOPHEN 5-325 MG PO TABS: 1.0000 | ORAL_TABLET | ORAL | Status: DC | PRN

## 2014-10-13 MED ORDER — BENZOCAINE-MENTHOL 20-0.5 % EX AERO
1.0000 "application " | INHALATION_SPRAY | CUTANEOUS | Status: DC | PRN
  Filled 2014-10-13: qty 56

## 2014-10-13 MED ORDER — FERROUS SULFATE 325 (65 FE) MG PO TABS
325.0000 mg | ORAL_TABLET | Freq: Two times a day (BID) | ORAL | Status: DC
  Administered 2014-10-13 – 2014-10-14 (×2): 325 mg via ORAL
  Filled 2014-10-13 (×2): qty 1

## 2014-10-13 MED ORDER — LANOLIN HYDROUS EX OINT: 1.0000 "application " | TOPICAL_OINTMENT | CUTANEOUS | Status: DC | PRN

## 2014-10-13 MED ORDER — METHYLERGONOVINE MALEATE 0.2 MG/ML IJ SOLN: 0.2000 mg | INTRAMUSCULAR | Status: DC | PRN

## 2014-10-13 MED ORDER — METHYLERGONOVINE MALEATE 0.2 MG PO TABS: 0.2000 mg | ORAL_TABLET | ORAL | Status: DC | PRN

## 2014-10-13 MED ORDER — ONDANSETRON HCL 4 MG PO TABS: 4.0000 mg | ORAL_TABLET | ORAL | Status: DC | PRN

## 2014-10-13 MED ORDER — SIMETHICONE 80 MG PO CHEW: 80.0000 mg | CHEWABLE_TABLET | ORAL | Status: DC | PRN

## 2014-10-13 MED ORDER — MAGNESIUM HYDROXIDE 400 MG/5ML PO SUSP: 30.0000 mL | ORAL | Status: DC | PRN

## 2014-10-13 MED ORDER — ONDANSETRON HCL 4 MG/2ML IJ SOLN: 4.0000 mg | INTRAMUSCULAR | Status: DC | PRN

## 2014-10-13 MED ORDER — LIDOCAINE HCL (PF) 1 % IJ SOLN
INTRAMUSCULAR | Status: AC
  Administered 2014-10-13: 30 mL
  Filled 2014-10-13: qty 30

## 2014-10-13 MED ORDER — IBUPROFEN 600 MG PO TABS
600.0000 mg | ORAL_TABLET | Freq: Four times a day (QID) | ORAL | Status: DC
  Administered 2014-10-13 – 2014-10-14 (×3): 600 mg via ORAL
  Filled 2014-10-13 (×4): qty 1

## 2014-10-13 MED ORDER — SENNOSIDES-DOCUSATE SODIUM 8.6-50 MG PO TABS
2.0000 | ORAL_TABLET | ORAL | Status: DC
  Administered 2014-10-14: 2 via ORAL
  Filled 2014-10-13: qty 2

## 2014-10-13 MED ORDER — DIPHENHYDRAMINE HCL 25 MG PO CAPS: 25.0000 mg | ORAL_CAPSULE | Freq: Four times a day (QID) | ORAL | Status: DC | PRN

## 2014-10-13 MED ORDER — WITCH HAZEL-GLYCERIN EX PADS: 1.0000 "application " | MEDICATED_PAD | CUTANEOUS | Status: DC | PRN

## 2014-10-13 MED ORDER — TETANUS-DIPHTH-ACELL PERTUSSIS 5-2.5-18.5 LF-MCG/0.5 IM SUSP: 0.5000 mL | Freq: Once | INTRAMUSCULAR | Status: DC

## 2014-10-13 MED ORDER — MEASLES, MUMPS & RUBELLA VAC ~~LOC~~ INJ: 0.5000 mL | INJECTION | Freq: Once | SUBCUTANEOUS | Status: DC

## 2014-10-13 MED ORDER — ZOLPIDEM TARTRATE 5 MG PO TABS: 5.0000 mg | ORAL_TABLET | Freq: Every evening | ORAL | Status: DC | PRN

## 2014-10-13 NOTE — Progress Notes (Signed)
Patient ID: Melanie Johnston, female   DOB: 03-12-1981, 34 y.o.   MRN: [id number] Nursery called requesting more recent HIV and HBsAg than 1 year ago (Pt had declined repeat OB panel at [facility] after SAB). Also needs admission RPR which pt missed due to precip delivery. Pt initially declined, but after explaining nursery request pt consents. Labs reordered.

## 2014-10-13 NOTE — MAU Note (Signed)
Pt taken to room [address] report given to receiving nurse, Clydie BraunKaren.

## 2014-10-13 NOTE — Lactation Note (Signed)
This note was copied from the chart of Melanie Gretta Coolmily Boyack. Lactation Consultation Note  Patient Name: Melanie Johnston NFAOZ'HToday's Date: 10/13/2014 Reason for consult: Initial assessment of this mom and baby at 8 hours pp.  Baby has already breastfed multiple times since birth and LATCH score=10 at recent assessment, per RN.  Mom breastfed her 473 yo exclusively for 6 months and continued to pump and provide ebm until he was 34 years old.  LC discussed and encouraged frequent STS and cue feedings.  Mom says she knows how to hand express her milk as needed. Mom encouraged to feed baby 8-12 times/24 hours and with feeding cues.  LC provided [employer] Resource brochure and [facility] and list of community and web site resources. LC encouraged review of Baby and Me pp 9, 14 and 20-25 for STS and BF information.    Maternal Data Formula Feeding for Exclusion: No Has patient been taught Hand Expression?: Yes (mom informs LC that she already knows how to hand express her milk) Does the patient have breastfeeding experience prior to this delivery?: Yes  Feeding Feeding Type: Breast Fed Length of feed: 0 min (held skin to skin, too tired)  LATCH Score/Interventions            recent LATCH score=10 per RN assessment           Lactation Tools Discussed/Used   STS, cue feedings, hand expresssion  Consult Status Consult Status: Follow-up Date: 10/14/14 Follow-up type: In-patient    Warrick ParisianBryant, Giara Mcgaughey [facility] 10/13/2014, 10:46 PM

## 2014-10-13 NOTE — H&P (Signed)
HPI: Melanie Johnston is a 34 y.o. year old 153P1011 female at 6731w1d weeks gestation by 7 week US who presents to [facility] reporting Labor. Precipitously delivered in [facility] by Thressa ShellerHeather Hogan, CNM.   Clinic  [facility]; desires waterbirth, attended class 12/9  Dating  7 weeks US  Genetic Screen    NIPS: Cathlean SauerHarmony is negative                MSAFP: negative    Female  GTT Early: Third trimester: Plans to decline GTT and do testing instead. Has supplies > all values wnl for two weeks  TDaP vaccine  Declined   Flu vaccine  Declined  GBS Neg  Contraception LAM  Baby Food Breast  Circumcision IP  Pediatrician [facility]--[facility]  Support Person Spouse   Rhophylac done 09/19/14  Patient Active Problem List   Diagnosis Date Noted  . Vaginal delivery 10/13/2014  . Supervision of high risk pregnancy in first trimester 03/31/2014  . Rh negative, maternal 10/17/2013  . Thyroid disease in pregnancy 10/14/2013  . Subluxation of peroneal tendon of left foot 07/16/2013    History OB History    Gravida Para Term Preterm AB TAB SAB Ectopic Multiple Living   3 1 1  1  1   1      Past Medical History  Diagnosis Date  . UTI (lower urinary tract infection)     occ.  . 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 Rh negative, maternal   . SAB (spontaneous abortion) 12/02/2013    Chromosomal analysis showed Trisomy 18.   Past Surgical History  Procedure Laterality Date  . Wisdom tooth extraction    . Ankle surgery      dislocation at age 34   Family History: family history is not on file. Social History:  reports that she has never smoked. She does not have any smokeless tobacco history on file. She reports that she does not drink alcohol or use illicit drugs.   Prenatal Transfer Tool  Maternal Diabetes: No Genetic Screening: Normal Maternal Ultrasounds/Referrals: Normal Fetal Ultrasounds or other Referrals:  None Maternal Substance Abuse:  No Significant Maternal  Medications:  Meds include: Other:  Significant Maternal Lab Results:  Lab values include: Group B Strep negative Other Comments:  On [place] thyroid.   Review of Systems  Constitutional: Negative for fever and chills.  Eyes: Negative for blurred vision.  Gastrointestinal: Positive for abdominal pain.  Neurological: Negative for headaches.    Dilation: 10 Effacement (%): 100 Station: +2 Exam by:: heather hogan Last menstrual period 01/04/2014, unknown if currently breastfeeding. Maternal Exam:  Uterine Assessment: Contraction strength is firm.  Contraction frequency is regular.   Abdomen: Fetal presentation: vertex  Introitus: Normal vulva. Amniotic fluid character: clear.  Pelvis: adequate for delivery.   Cervix: Cervix evaluated by digital exam.     Fetal Exam Fetal Monitor Review: Mode: ultrasound.   Baseline rate: 125.  Variability: moderate (6-25 bpm).    Fetal State Assessment: Category I - tracings are normal.     Physical Exam  Nursing note and vitals reviewed. Constitutional: She is oriented to person, place, and time. She appears well-developed and well-nourished. She appears distressed.  Eyes: Conjunctivae are normal.  Cardiovascular: Normal rate, regular rhythm and normal heart sounds.   Respiratory: Effort normal and breath sounds normal.  GI: Soft. There is no tenderness.  Musculoskeletal: She exhibits no edema or tenderness.  Neurological: She is alert and oriented to person,  place, and time. She has normal reflexes.  Skin: Skin is warm and dry.  Psychiatric: She has a normal mood and affect.    Prenatal labs: ABO, Rh: A/NEG/-- (02/19 1557) Antibody: POS (07/10 1156) Rubella: 8.07 (02/19 1557) RPR: NON REAC (02/19 1557)  HBsAg: NEGATIVE (02/19 1557)  HIV: NON REACTIVE (02/19 1557)  GBS:   Neg  Assessment: 1. Labor: Precip delivery 2. Fetal Wellbeing: Category I  3. Pain Control: None 4. GBS: Neg 5. 39.1 week IUP  Plan:  1. Admit to BS  per consult with MD 2. Routine L&D orders 3. Analgesia/anesthesia PRN  4. IP circ  Tyreesha Maharaj 10/13/2014, 2:44 PM

## 2014-10-14 ENCOUNTER — Encounter: Payer: No Typology Code available for payment source | Admitting: Advanced Practice Midwife

## 2014-10-14 ENCOUNTER — Encounter (HOSPITAL_COMMUNITY): Payer: Self-pay

## 2014-10-14 LAB — HEPATITIS B SURFACE ANTIGEN: HEP B S AG: NEGATIVE

## 2014-10-14 MED ORDER — RHO D IMMUNE GLOBULIN 1500 UNIT/2ML IJ SOSY
300.0000 ug | PREFILLED_SYRINGE | Freq: Once | INTRAMUSCULAR | Status: AC
  Administered 2014-10-14: 300 ug via INTRAMUSCULAR
  Filled 2014-10-14: qty 2

## 2014-10-14 NOTE — Discharge Summary (Signed)
Obstetric Discharge Summary Reason for Admission: onset of labor Prenatal Procedures: none Intrapartum Procedures: spontaneous vaginal delivery Postpartum Procedures: none Complications-Operative and Postpartum: 2nd degree perineal laceration HEMOGLOBIN  Date Value Ref Range Status  10/13/2014 12.7 12.0 - 15.0 g/dL Final   HCT  Date Value Ref Range Status  10/13/2014 36.3 36.0 - 46.0 % Final   Melanie Johnston is a 34 year old G3P2012 PPD#1 after SVD. She is up and moving with mild pain well controlled with motrin. She has urinated but no bowel movements or flatus. She is tolerating PO. She had a boy, is breast feeding and using lactation amenorrhea as contraception. She is requesting an inpatient circumcision for her son.   Physical Exam:  General: alert and no distress Lochia: appropriate Uterine Fundus: firm Incision: N/A DVT Evaluation: No evidence of DVT seen on physical exam.  Discharge Diagnoses: Term Pregnancy-delivered  Discharge Information: Date: 10/14/2014 Activity: pelvic rest Diet: routine Medications: Ibuprofen Condition: stable Instructions: refer to practice specific booklet Discharge to: home   Newborn Data: Live born female  Birth Weight: 6 lb 15.1 oz (3150 g) APGAR: 8, 9  Home with mother.  Ahmaya Ostermiller M 10/14/2014, 8:58 AM

## 2014-10-14 NOTE — Discharge Instructions (Signed)

## 2014-10-14 NOTE — Progress Notes (Deleted)
Post Partum Day 1 Subjective: Melanie Johnston is a 34 year old G3P2012 PPD#1 after SVD. She is up and moving with minimal pain well controlled with motrin. She has urinated but has had no bowel movements or flatus. She is tolerating PO well. She had a boy, is breastfeeding and desires LAM for contraception. She consented for an inpatient circumcision.   Objective: Blood pressure 117/71, pulse 60, temperature 98.1 F (36.7 C), temperature source Oral, resp. rate 18, last menstrual period 01/04/2014, unknown if currently breastfeeding.  Physical Exam:  General: alert and no distress Lochia: appropriate Uterine Fundus: firm Incision: N/A DVT Evaluation: No evidence of DVT seen on physical exam.   Recent Labs  10/13/14 2145  HGB 12.7  HCT 36.3    Assessment/Plan: Discharge home once passing gas or bowel movement Waiting on results from ABO/Rh and RPR labs   LOS: 1 day   Tymara Saur M 10/14/2014, 7:36 AM

## 2014-10-15 LAB — RH IG WORKUP (INCLUDES ABO/RH)
ABO/RH(D): A NEG
Antibody Screen: POSITIVE
FETAL SCREEN: NEGATIVE
Gestational Age(Wks): 39.1
Unit division: 0

## 2014-10-15 LAB — RPR: RPR: NONREACTIVE

## 2014-11-28 ENCOUNTER — Ambulatory Visit (INDEPENDENT_AMBULATORY_CARE_PROVIDER_SITE_OTHER): Payer: No Typology Code available for payment source | Admitting: Obstetrics & Gynecology

## 2014-11-28 ENCOUNTER — Encounter: Payer: Self-pay | Admitting: Obstetrics & Gynecology

## 2014-11-28 VITALS — BP 116/73 | HR 62 | Resp 16 | Ht 64.5 in | Wt 122.0 lb

## 2014-11-28 DIAGNOSIS — Z1151 Encounter for screening for human papillomavirus (HPV): Secondary | ICD-10-CM | POA: Diagnosis not present

## 2014-11-28 DIAGNOSIS — Z8742 Personal history of other diseases of the female genital tract: Secondary | ICD-10-CM

## 2014-11-28 DIAGNOSIS — Z124 Encounter for screening for malignant neoplasm of cervix: Secondary | ICD-10-CM | POA: Diagnosis not present

## 2014-11-28 DIAGNOSIS — IMO0001 Reserved for inherently not codable concepts without codable children: Secondary | ICD-10-CM

## 2014-11-28 NOTE — Progress Notes (Signed)
  Subjective:     Melanie Johnston is a 34 y.o.MW P2 female who presents for a postpartum visit. She is 6 weeks postpartum following a spontaneous vaginal delivery. I have fully reviewed the prenatal and intrapartum course. The delivery was at 39 gestational weeks. Outcome: spontaneous vaginal delivery. Anesthesia: none. Postpartum course has been normal`. Baby's course has been normal. Baby is feeding by breast. Bleeding no bleeding. Bowel function is normal. Bladder function is normal. Patient is sexually active. Contraception method is none. Postpartum depression screening: negative.  The following portions of the patient's history were reviewed and updated as appropriate: allergies, current medications, past family history, past medical history, past social history, past surgical history and problem list.  Review of Systems Pertinent items are noted in HPI.   Objective:    BP 116/73 mmHg  Pulse 62  Resp 16  Ht 5' 4.5" (1.638 m)  Wt 122 lb (55.339 kg)  BMI 20.63 kg/m2  Breastfeeding? Yes  General:  alert   Breasts:  inspection negative, no nipple discharge or bleeding, no masses or nodularity palpable  Lungs: clear to auscultation bilaterally  Heart:  regular rate and rhythm, S1, S2 normal, no murmur, click, rub or gallop  Abdomen: soft, non-tender; bowel sounds normal; no masses,  no organomegaly   Vulva:  normal  Vagina: normal vagina  Cervix:  anteverted  Corpus: normal  Adnexa:  no mass, fullness, tenderness  Rectal Exam: Not performed.        Assessment:     Normal postpartum exam. Pap smear was done at today's visit.   Plan:    1. Contraception: coitus interruptus 2. Follow up in: 1 year or as needed.

## 2014-11-29 LAB — CYTOLOGY - PAP

## 2018-01-14 ENCOUNTER — Ambulatory Visit (INDEPENDENT_AMBULATORY_CARE_PROVIDER_SITE_OTHER): Payer: No Typology Code available for payment source | Admitting: Sports Medicine

## 2018-01-14 ENCOUNTER — Encounter: Payer: Self-pay | Admitting: Sports Medicine

## 2018-01-14 DIAGNOSIS — IMO0001 Reserved for inherently not codable concepts without codable children: Secondary | ICD-10-CM

## 2018-01-14 DIAGNOSIS — S9305XD Dislocation of left ankle joint, subsequent encounter: Secondary | ICD-10-CM

## 2018-01-14 NOTE — Assessment & Plan Note (Signed)
Custom orthotics as above. 

## 2018-01-14 NOTE — Progress Notes (Signed)
    Patient was fitted for a : standard, cushioned, semi-rigid orthotic. The orthotic was heated and afterward the patient stood on the orthotic blank positioned on the orthotic stand. The patient was positioned in subtalar neutral position and 10 degrees of ankle dorsiflexion in a weight bearing stance. After completion of molding, a stable base was applied to the orthotic blank. The blank was ground to a stable position for weight bearing. Size: 6 Base: White EVA Additional Posting and Padding: None The patient ambulated these, and they were very comfortable.  I spent 40 minutes with this patient, greater than 50% was face-to-face time counseling regarding the below diagnosis.  ___________________________________________ Thomas J. Thekkekandam, M.D., ABFM., CAQSM. Primary Care and Sports Medicine [facility]  Adjunct Instructor of Family Medicine  [employer]   

## 2019-05-24 ENCOUNTER — Other Ambulatory Visit: Payer: Self-pay | Admitting: Sports Medicine

## 2019-05-24 DIAGNOSIS — S9305XD Dislocation of left ankle joint, subsequent encounter: Secondary | ICD-10-CM

## 2019-05-24 DIAGNOSIS — IMO0001 Reserved for inherently not codable concepts without codable children: Secondary | ICD-10-CM

## 2019-06-02 ENCOUNTER — Encounter: Payer: No Typology Code available for payment source | Admitting: Family Medicine

## 2019-06-14 ENCOUNTER — Encounter: Payer: Self-pay | Admitting: Family Medicine

## 2019-06-14 ENCOUNTER — Other Ambulatory Visit: Payer: Self-pay

## 2019-06-14 ENCOUNTER — Ambulatory Visit (INDEPENDENT_AMBULATORY_CARE_PROVIDER_SITE_OTHER): Payer: No Typology Code available for payment source | Admitting: Family Medicine

## 2019-06-14 DIAGNOSIS — S9305XD Dislocation of left ankle joint, subsequent encounter: Secondary | ICD-10-CM | POA: Diagnosis not present

## 2019-06-14 DIAGNOSIS — IMO0001 Reserved for inherently not codable concepts without codable children: Secondary | ICD-10-CM

## 2019-06-14 NOTE — Progress Notes (Signed)
Melanie Johnston - 38 y.o. female MRN [id number]  Date of birth: [birth year]  SUBJECTIVE:  Including CC & ROS.  Chief Complaint  Patient presents with  . Foot Orthotics    Melanie Johnston is a 38 y.o. female that is presenting with bilateral foot pain.  She has a history of plantar fasciitis as well as peroneal subluxation.  She has had orthotics made in the past which seem to correct these problems.  She denies any significant pain today.  Tends to run on a fairly regular basis.  Her most recent orthotics broke at the forefoot.   Review of Systems  Constitutional: Negative for fever.  HENT: Negative for congestion.   Respiratory: Negative for cough.   Cardiovascular: Negative for chest pain.  Gastrointestinal: Negative for abdominal pain.  Musculoskeletal: Negative for back pain.  Skin: Negative for color change.  Neurological: Negative for weakness.  Hematological: Negative for adenopathy.    HISTORY: Past Medical, Surgical, Social, and Family History Reviewed & Updated per EMR.   Pertinent Historical Findings include:  Past Medical History:  Diagnosis Date  . 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 Rh negative, maternal   . SAB (spontaneous abortion) 12/02/2013   Chromosomal analysis showed Trisomy 18.  Marland Kitchen UTI (lower urinary tract infection)    occ.    Past Surgical History:  Procedure Laterality Date  . ANKLE SURGERY     dislocation at age 11  . WISDOM TOOTH EXTRACTION      No Known Allergies  No family history on file.   Social History   Socioeconomic History  . Marital status: Married    Spouse name: Not on file  . Number of children: Not on file  . Years of education: Not on file  . Highest education level: Not on file  Occupational History  . Not on file  Social Needs  . Financial resource strain: Not on file  . Food insecurity    Worry: Not on file    Inability: Not on file  . Transportation needs   Medical: Not on file    Non-medical: Not on file  Tobacco Use  . Smoking status: Never Smoker  . Smokeless tobacco: Never Used  Substance and Sexual Activity  . Alcohol use: No  . Drug use: No  . Sexual activity: Yes    Birth control/protection: None  Lifestyle  . Physical activity    Days per week: Not on file    Minutes per session: Not on file  . Stress: Not on file  Relationships  . Social Herbalist on phone: Not on file    Gets together: Not on file    Attends religious service: Not on file    Active member of club or organization: Not on file    Attends meetings of clubs or organizations: Not on file    Relationship status: Not on file  . Intimate partner violence    Fear of current or ex partner: Not on file    Emotionally abused: Not on file    Physically abused: Not on file    Forced sexual activity: Not on file  Other Topics Concern  . Not on file  Social History Narrative  . Not on file     PHYSICAL EXAM:  VS: BP 131/84   Pulse 68   Ht 5\' 5"  (1.651 m)   Wt 115 lb (52.2 kg)   BMI 19.14 kg/m  Physical Exam Gen:  NAD, alert, cooperative with exam, well-appearing ENT: normal lips, normal nasal mucosa,  Eye: normal EOM, normal conjunctiva and lids CV:  no edema, +2 pedal pulses   Resp: no accessory muscle use, non-labored, Skin: no rashes, no areas of induration  Neuro: normal tone, normal sensation to touch Psych:  normal insight, alert and oriented MSK:  Left and right foot: No swelling or ecchymosis. Normal range of motion. Normal strength to resistance. No tenderness to palpation at the calcaneus. Neurovascularly intact  Patient was fitted for a standard, cushioned, semi-rigid orthotic. The orthotic was heated and afterward the patient stood on the orthotic blank positioned on the orthotic stand. The patient was positioned in subtalar neutral position and 10 degrees of ankle dorsiflexion in a weight bearing stance. After completion of  molding, a stable base was applied to the orthotic blank. The blank was ground to a stable position for weight bearing. Size: 8 Base: Blue EVA Additional Posting and Padding: First ray post applied bilaterally The patient ambulated these, and they were very comfortable.      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  Orthotics have helped in the past. -Orthotics today. -Counseled on supportive care. -Counseled on management.

## 2019-06-14 NOTE — Assessment & Plan Note (Signed)
History of subluxation as well as plantar fasciitis.  Orthotics have helped in the past. -Orthotics today. -Counseled on supportive care. -Counseled on management.

## 2022-03-28 ENCOUNTER — Encounter: Payer: No Typology Code available for payment source | Admitting: Family Medicine

## 2022-04-01 ENCOUNTER — Encounter: Payer: No Typology Code available for payment source | Admitting: Family Medicine

## 2022-04-09 ENCOUNTER — Ambulatory Visit (INDEPENDENT_AMBULATORY_CARE_PROVIDER_SITE_OTHER): Payer: No Typology Code available for payment source | Admitting: Family Medicine

## 2022-04-09 ENCOUNTER — Encounter: Payer: Self-pay | Admitting: Family Medicine

## 2022-04-09 DIAGNOSIS — S93332A Other subluxation of left foot, initial encounter: Secondary | ICD-10-CM

## 2022-04-09 NOTE — Assessment & Plan Note (Signed)
Acute on chronic in nature.  Symptoms are improved with additional support. -Counseled on home exercise therapy and supportive care. -Orthotics with first ray post. -Could consider adding metatarsal pads

## 2022-04-09 NOTE — Progress Notes (Signed)
  Melanie Johnston - 41 y.o. female MRN [id number]  Date of birth: 1981-04-04  SUBJECTIVE:  Including CC & ROS.  No chief complaint on file.   Melanie Johnston is a 41 y.o. female that is presenting with acute on chronic foot pain.  Pain is exacerbated with running.    Review of Systems See HPI   HISTORY: Past Medical, Surgical, Social, and Family History Reviewed & Updated per EMR.   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   UTI (lower urinary tract infection)    occ.    Past Surgical History:  Procedure Laterality Date   ANKLE SURGERY     dislocation at age 31   WISDOM TOOTH EXTRACTION       PHYSICAL EXAM:  VS: Ht 5\' 5"  (1.651 m)   Wt 115 lb (52.2 kg)   BMI 19.14 kg/m  Physical Exam Gen: NAD, alert, cooperative with exam, well-appearing MSK:  Neurovascularly intact    Patient was fitted for a standard, cushioned, semi-rigid orthotic. The orthotic was heated and afterward the patient stood on the orthotic blank positioned on the orthotic stand. The patient was positioned in subtalar neutral position and 10 degrees of ankle dorsiflexion in a weight bearing stance. After completion of molding, a stable base was applied to the orthotic blank. The blank was ground to a stable position for weight bearing. Size: 8 Pairs: 2 Base: Blue EVA Additional Posting and Padding: First ray post bilaterally The patient ambulated these, and they were very comfortable.     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  Symptoms are improved with additional support. -Counseled on home exercise therapy and supportive care. -Orthotics with first ray post. -Could consider adding metatarsal pads

## 2022-12-12 ENCOUNTER — Encounter: Payer: Self-pay | Admitting: *Deleted

## 2023-02-13 ENCOUNTER — Ambulatory Visit: Admit: 2023-02-13 | Attending: Student in an Organized Health Care Education/Training Program

## 2023-02-13 ENCOUNTER — Encounter
Admit: 2023-02-13 | Payer: PRIVATE HEALTH INSURANCE | Attending: Student in an Organized Health Care Education/Training Program

## 2023-02-13 DIAGNOSIS — R7303 Prediabetes: Secondary | ICD-10-CM

## 2023-02-13 DIAGNOSIS — M797 Fibromyalgia: Secondary | ICD-10-CM

## 2023-02-13 DIAGNOSIS — K900 Celiac disease: Secondary | ICD-10-CM

## 2023-02-13 DIAGNOSIS — Z01419 Encounter for gynecological examination (general) (routine) without abnormal findings: Secondary | ICD-10-CM

## 2023-02-13 DIAGNOSIS — K589 Irritable bowel syndrome without diarrhea: Secondary | ICD-10-CM

## 2023-02-13 DIAGNOSIS — F419 Anxiety disorder, unspecified: Secondary | ICD-10-CM

## 2023-02-13 DIAGNOSIS — R300 Dysuria: Secondary | ICD-10-CM

## 2023-02-13 DIAGNOSIS — R140 Abdominal distension (gaseous): Secondary | ICD-10-CM

## 2023-02-13 DIAGNOSIS — Z8489 Family history of other specified conditions: Secondary | ICD-10-CM

## 2023-02-14 NOTE — Unmapped
Chief complaint: Annual visit, new patient History of Present Illness: Brandi Watson is a 42 y.o.  woman here for routine gynecologic visit. New patient, establishing care. [name] has been dealing with unexplained abdominal bloating that started in 2020. Is it intermittent. Has had a workup with GI in [place] (imaging completed and 6 rounds of colonics). Was told by her prior naturopath that they had exhausted all options. She has not seen GYN yet for this issue, but her sister was just diagnosed with fibroids so she is wondering if she may have them as well. States that both her mother and MGM have fibroids as well. She denies any GI symptoms (no cramping, no change in bowels, and no nausea). Menses have been regular and monthly her entire life. States they have progressively gotten heavier and more painful. Has not been watching the bloating in relation to her menses because she didn't think they were related. However, this month the bloating started right before her menses. Still experiencing bloating but it is no longer hard as a rock. States when she has these episodes it looks like she is pregnant. Admits to a history of IBS and Celiac disease. States her celiac disease is well controlled with diet. No IBS symptoms besides bloating. Denies constipation and diarrhea or any changes in stool pattern. She has not yet initiated mammogram screening. Reports that in [place] they had thermography locations and that she would like to do that when she returns instead of a mammogram. Reviewed the use of mammograms to detect breast cancer early and the lack of data that supports thermography as a primary way to screen for breast cancer. Pt states she is not worried about the radiation but is concerned regarding the pain associated with getting her breasts squeezed.Menses: Menses are monthly; see above Sexually active: yes, with husband, no complaints Conception plans: Desires to avoid pregnancy Contraception: Declines Breast: No complaints. No nipple discharge.Bladder: Feel like I have to go all day; Never feels fully emptied Bowel: H/o IBS and Celiac. See above.Patient's last menstrual period was 02/08/2023.Diet: gluten free (celiac disease)Exercise: exercises Social: moved from [place] earlier this yearMental Health: no concernsDomestic Violence: no concerns, safe at [facility] Hx: No obstetric history on file.- Last Pap: 12/2021. No history abnormal Paps.- Contraception history: - History STIs: denies- Last mammogram: has never had one; does not want boobs to be squished- H/o ovarian cyst - Mom, grandma, and sister all have fibroids OB History No obstetric history on file. Past Medical History: Diagnosis Date  Anxiety   Celiac disease   Fibromyalgia   IBS (irritable bowel syndrome)   Prediabetes  Past Surgical History: Procedure Laterality Date  ADENOIDECTOMY  1990  FRACTURE SURGERY  2019  2020  JOINT REPLACEMENT  2019  TONSILLECTOMY AND ADENOIDECTOMY  1990 Family History Problem Relation Age of Onset  Fibroids Mother   Hypertension Mother   Hypertension Father   Fibroids Sister   Breast cancer Maternal Aunt   Fibroids Maternal Grandmother   Colon cancer Paternal Grandmother  No current outpatient medications on file.No Known AllergiesPhysical Exam:[name] was present as chaperone for all sensitive portions of the examBP (!) 150/88  - Ht 5' 9 (1.753 m)  - Wt 80.3 kg  - LMP 02/08/2023  - BMI 26.14 kg/m? Gen: Well-appearing. Neck: Normal. Thyroid symmetric and without masses, no thyromegaly appreciated.Cardiovascular: Regular rate and rhythm, no murmurs.Respiratory: Breathing normally on RA. Clear to ascultation bilaterally.Breast: Symmetric. No dimples or retraction on supine exam. Nontender to palpation. No masses.  No nipple discharge. No skin changes.Abdomen: Normal. Soft, nontender, nondistended.  Speculum: Normal external female genitalia. Normal vaginal mucosa. Normal appearing cervix. Physiologic appearing discharge.Bimanual exam: Cervix nontender. Uterus mobile, normal in size/shape. Nontender. No adnexal fullness or tenderness. No palpable pelvic masses.Assessment and Plan: Brandi Watson is a 42 y.o. who presents for routine gynecologic visit.Routine Gyn Care: - Paps: Discussed current ASCCP / ACOG guidelines. Pap smear reports normal pap last year. Release of records completed and signed today to obtain history.- STI testing: [facility] deferred- Contraception: declines, OK with pregnancy if happens - Mammogram: Recommended mammogram at least every 2 years. Will consider, declines order today. - Colonoscopy: Routine screening- Mental health support provided- Screening blood work and immunizations with PCP.Bloating: Ddx reviewed including fibroids, endometriosis, flare of IBS or other GI origin, etc. TVUS ordered, pt to schedule. If normal, encouraged second opinion with GI. Increased frequency/incomplete bladder emptying: Urinalysis and urine culture ordered to r/o UTI. Reviewed potential bladder irritants. Discussed PFPT/urology referral if persistent.Return in one year or sooner as needed.Virgia Kelner APRNSound Ob Gyn

## 2023-02-25 ENCOUNTER — Encounter
Admit: 2023-02-25 | Payer: PRIVATE HEALTH INSURANCE | Attending: Student in an Organized Health Care Education/Training Program

## 2023-03-19 ENCOUNTER — Inpatient Hospital Stay: Admit: 2023-03-19 | Discharge: 2023-03-19

## 2023-03-19 DIAGNOSIS — Z8489 Family history of other specified conditions: Secondary | ICD-10-CM

## 2023-03-19 DIAGNOSIS — Z0489 Encounter for examination and observation for other specified reasons: Secondary | ICD-10-CM

## 2023-03-25 ENCOUNTER — Telehealth
Admit: 2023-03-25 | Payer: PRIVATE HEALTH INSURANCE | Attending: Student in an Organized Health Care Education/Training Program

## 2023-03-25 NOTE — Telephone Encounter
Pt calling about her recent US results.  She is aware Leeroy Bock is not in today but I will send her a message. mw

## 2023-03-26 NOTE — Telephone Encounter
Call to [facility], no answer, LVM to cb.

## 2023-03-27 ENCOUNTER — Encounter
Admit: 2023-03-27 | Payer: PRIVATE HEALTH INSURANCE | Attending: Student in an Organized Health Care Education/Training Program

## 2023-03-27 DIAGNOSIS — D271 Benign neoplasm of left ovary: Secondary | ICD-10-CM

## 2023-03-27 NOTE — Telephone Encounter
Call to [facility] to review results. Discussed potential polyp causing heavier bleeding and recommendations for MRI of pelvic to further assess potential dermoid in left ovary. Will order MRI of pelvic for patient to schedule. Discussed meeting with MD after completion to review results and potential management options. All questions answered.

## 2023-04-07 NOTE — Other
Pt aware. MRI of pelvic ordered. Can we please add her to the tickler to ensure she follows up with MRI?

## 2023-05-10 ENCOUNTER — Inpatient Hospital Stay: Admit: 2023-05-10 | Discharge: 2023-05-10 | Payer: BLUE CROSS/BLUE SHIELD

## 2023-05-10 DIAGNOSIS — D271 Benign neoplasm of left ovary: Secondary | ICD-10-CM

## 2023-05-10 MED ORDER — GADOTERATE MEGLUMINE 0.5 MMOL/ML (376.9 MG/ML) INTRAVENOUS SOLUTION
0.5 | Freq: Once | INTRAVENOUS | Status: CP | PRN
Start: 2023-05-10 — End: ?
  Administered 2023-05-10: 21:00:00 0.5 mL via INTRAVENOUS

## 2023-05-10 MED ORDER — SODIUM CHLORIDE 0.9 % BOLUS (NEW BAG)
0.9 | Freq: Once | INTRAVENOUS | Status: CP | PRN
Start: 2023-05-10 — End: ?
  Administered 2023-05-10: 21:00:00 0.9 mL/h via INTRAVENOUS

## 2023-05-15 ENCOUNTER — Telehealth
Admit: 2023-05-15 | Payer: PRIVATE HEALTH INSURANCE | Attending: Student in an Organized Health Care Education/Training Program

## 2023-05-15 NOTE — Telephone Encounter
 PC from pt   she received the message to call office and schedule appt with MD for next  steps   patient decided not to schedule appt.   She stated her Mom reads results for a living and found nothing wrong or to discuss    patient would like to speak to [facility]

## 2023-05-15 NOTE — Telephone Encounter
 Call to patient to review Pelvic MRI results. 1. Bilateral ovaries are within normal limits. No abnormal T1 or T2 signal identified in the left ovary to suggest fat-containing lesion such as dermoid.2. Previously seen echogenic lesion in the lower uterine segment seen on ultrasound is not well delineated on today's MRI. This lesion is likely still present. Consider direct visualization as previously recommended.Discussed recommendations for direct visualization of lower uterine segment given finding on prior pelvic US. Pt with increased bleeding for the last several months, may be due to presence of polyp. Discussed potential for sono hyst but would defer to MDs on plan/management. Pt states it is hard to schedule an appointment so she will call back at a later date to schedule. Likely may establish care at a different office for convenience because of the [facility] office closing. All questions answered.
